# Patient Record
Sex: Female | Born: 1980 | Race: Black or African American | Hispanic: No | Marital: Single | Smoking: Never smoker
Health system: Southern US, Community
[De-identification: ages and names within clinical notes are randomized; demographics above are authoritative.]

## PROBLEM LIST (undated history)

## (undated) ENCOUNTER — Inpatient Hospital Stay (HOSPITAL_COMMUNITY): Payer: Self-pay

## (undated) DIAGNOSIS — O4290 Premature rupture of membranes, unspecified as to length of time between rupture and onset of labor, unspecified weeks of gestation: Secondary | ICD-10-CM

## (undated) DIAGNOSIS — R51 Headache: Secondary | ICD-10-CM

## (undated) DIAGNOSIS — D649 Anemia, unspecified: Secondary | ICD-10-CM

## (undated) HISTORY — PX: NO PAST SURGERIES: SHX2092

---

## 2007-03-07 ENCOUNTER — Emergency Department: Payer: Self-pay | Admitting: Emergency Medicine

## 2007-12-08 ENCOUNTER — Emergency Department: Payer: Self-pay | Admitting: Emergency Medicine

## 2007-12-08 ENCOUNTER — Other Ambulatory Visit: Payer: Self-pay

## 2008-05-18 IMAGING — CT CT ABD-PELV W/O CM
1 of 2 series · 15 of 32 positions shown, 19 images · non-contrast
Comparison: none

REASON FOR EXAM: (1) rm 3   abd pain; (2) rm 3  abd pain
COMMENTS:

[Series 2: stone · axial · 0.54mm/px · z∈[-4,+376]mm · 15 of 142 slices shown, 19 images]
[im 10/142  soft-tissue]
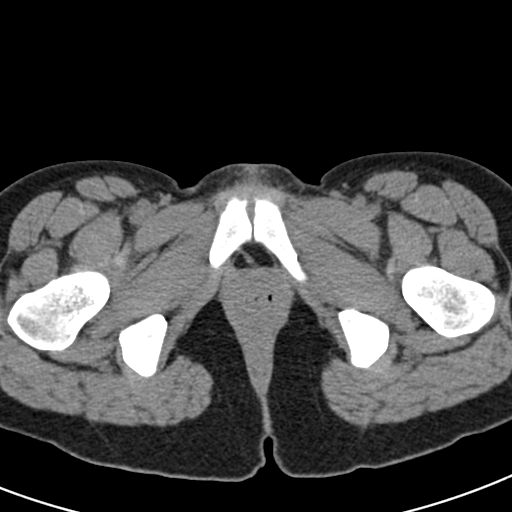
[im 10/142  bone]
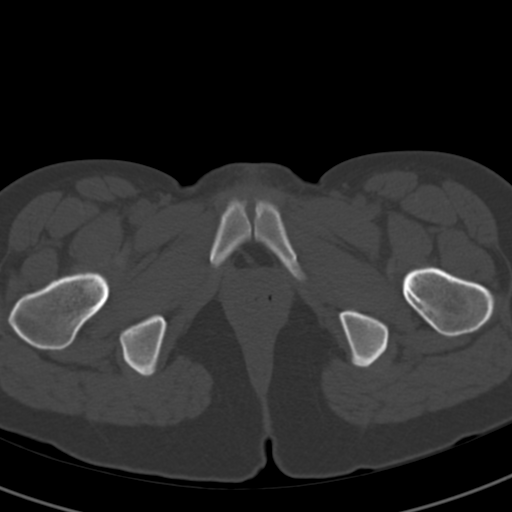
[im 20/142  soft-tissue]
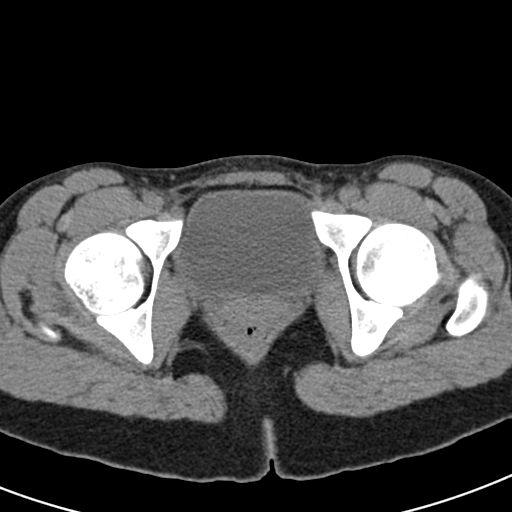
[im 30/142  soft-tissue]
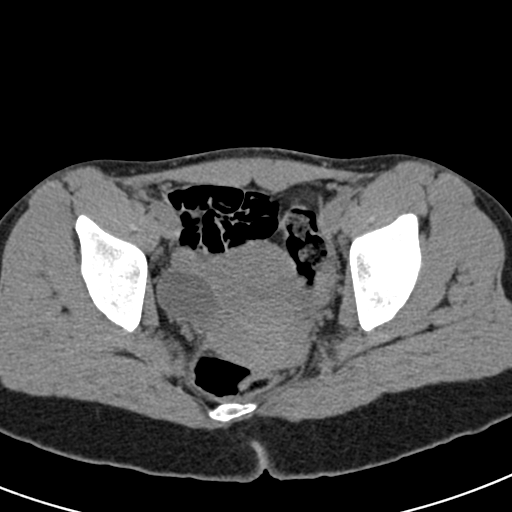
[im 39/142  soft-tissue]
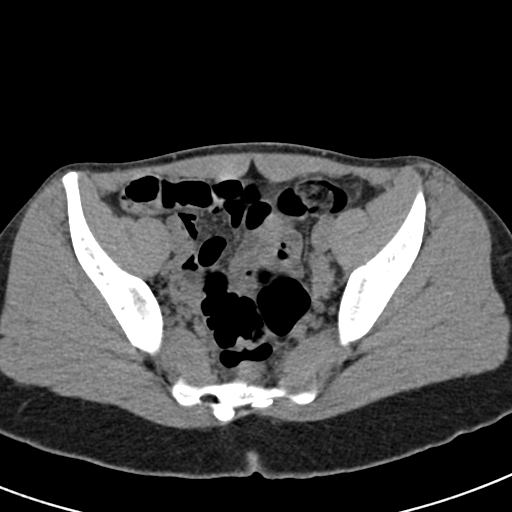
[im 49/142  soft-tissue]
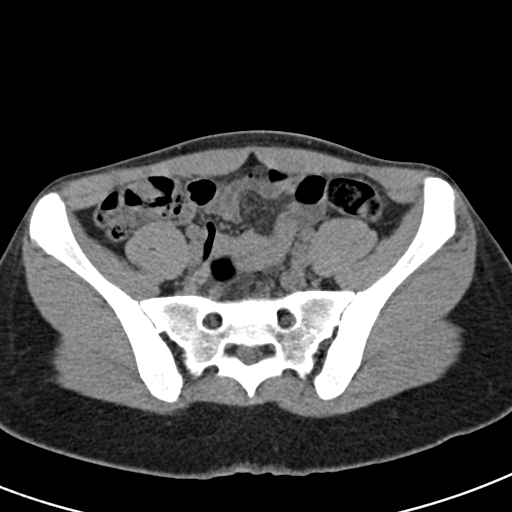
[im 59/142  soft-tissue]
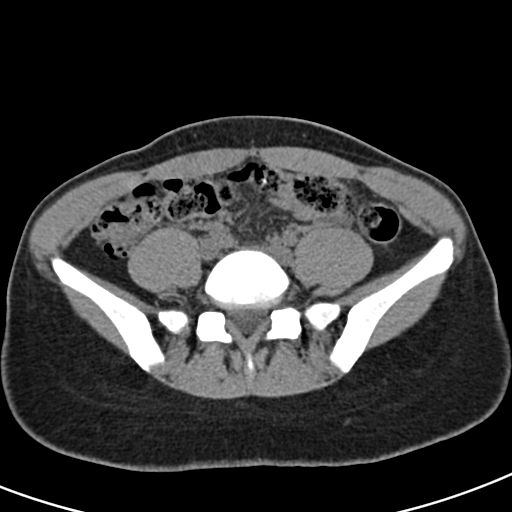
[im 73/142  soft-tissue]
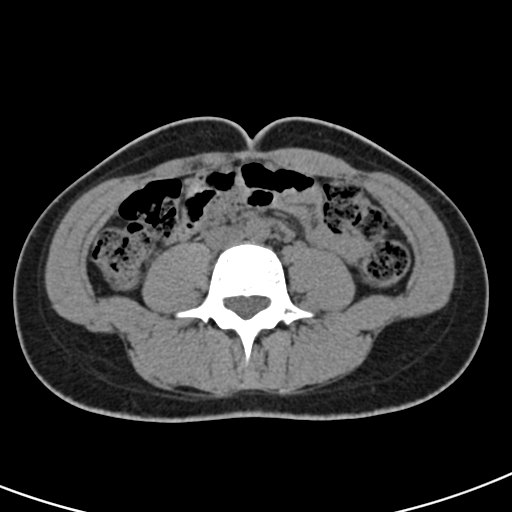
[im 83/142  soft-tissue]
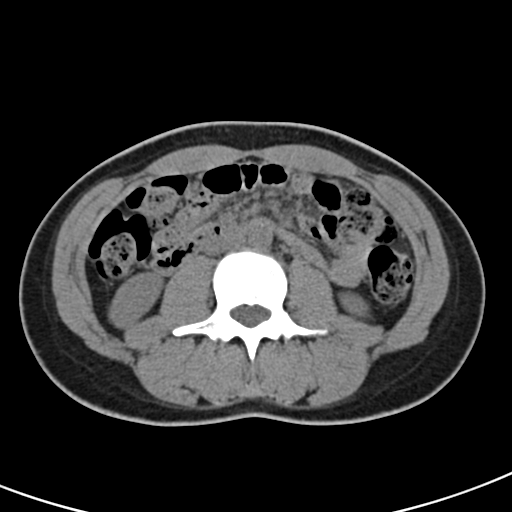
[im 93/142  soft-tissue]
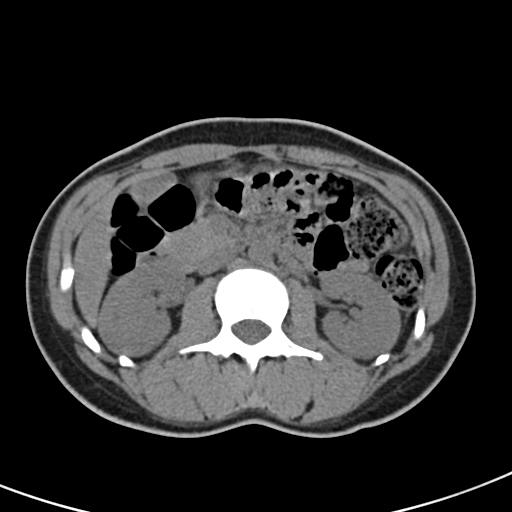
[im 93/142  bone]
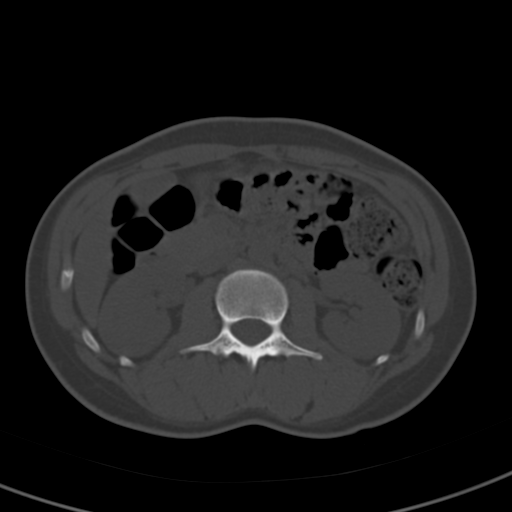
[im 103/142  soft-tissue]
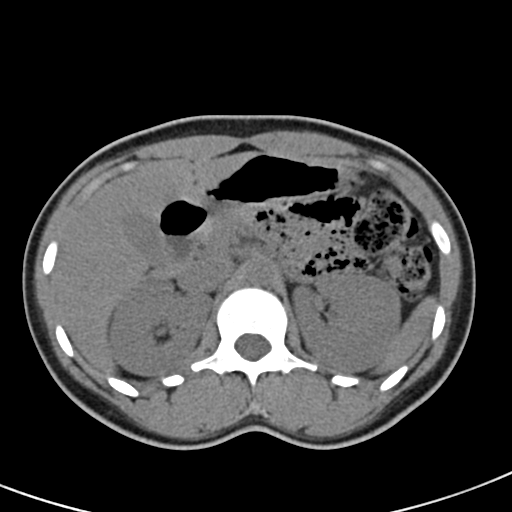
[im 112/142  soft-tissue]
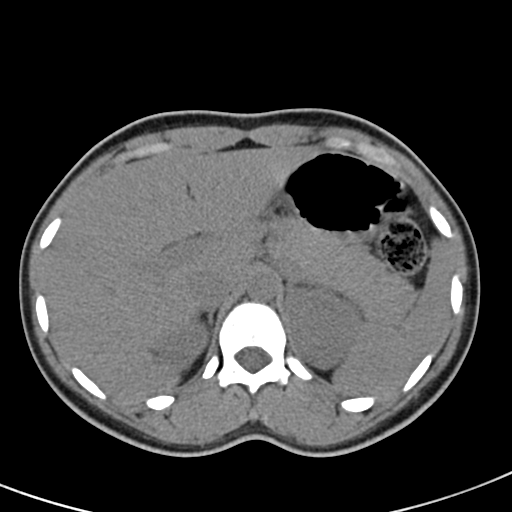
[im 122/142  soft-tissue]
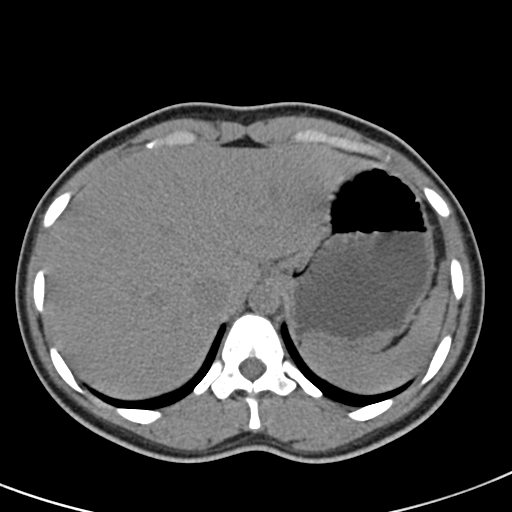
[im 122/142  lung]
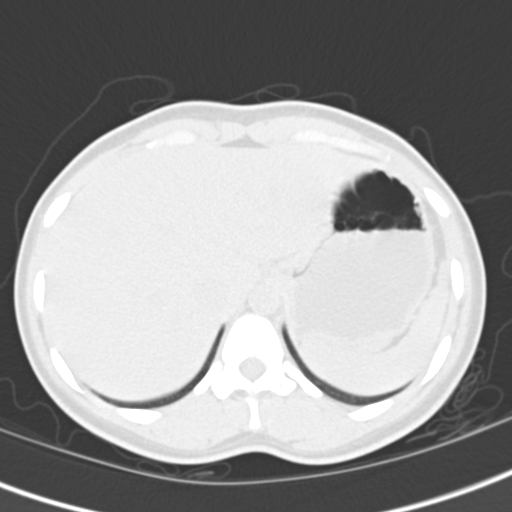
[im 127/142  lung]
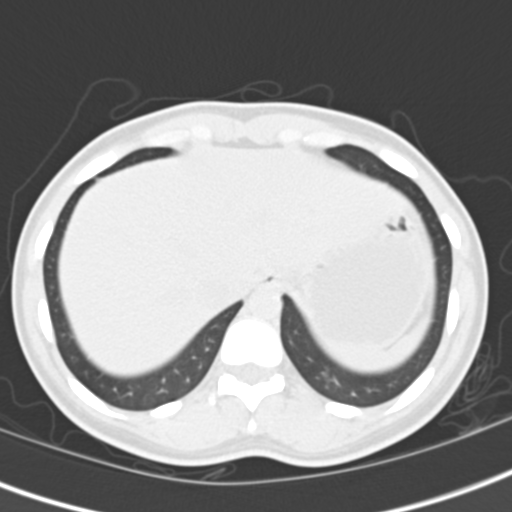
[im 132/142  soft-tissue]
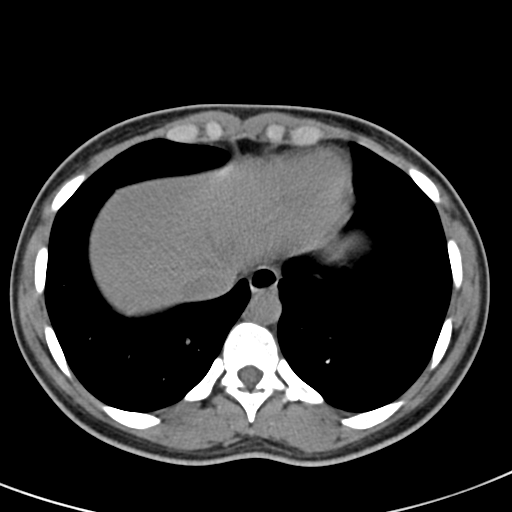
[im 132/142  lung]
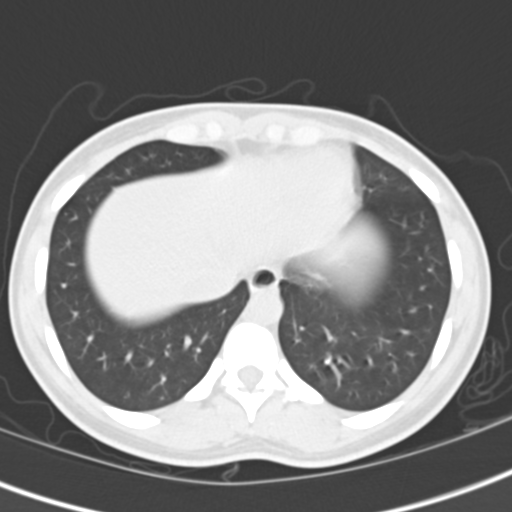
[im 137/142  lung]
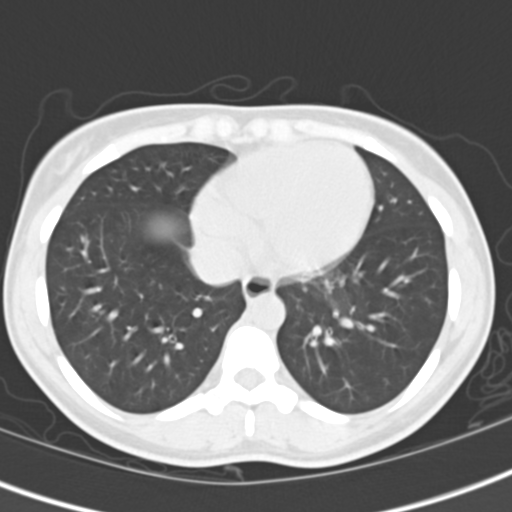

[15 of 32 positions shown; findings below may reference images not displayed]

PROCEDURE:     CT  - CT ABDOMEN AND PELVIS W[DATE] [DATE]

RESULT:      The study was performed without oral or intravenous contrast
material as requested.

The liver, stomach, spleen, pancreas, gallbladder, kidneys, and adrenal
glands are normal in appearance. The paraaortic and pericaval regions are
normal in appearance as well for the noncontrast study. The bowel gas
pattern is within the limits of normal. There is a radiodense structure
which appear to be intraluminal that may reflect a radiodense medication
seen best on image to 86 in the upper aspect of the pelvis. There are no
findings suspicious for acute appendicitis. I see no significant free fluid
within the pelvis. There is a cystic adnexal process on the RIGHT which
measures at least 3.5 x 2.7 cm. The uterus and LEFT adnexal structures are
normal in appearance for the noncontrast exam. The partially distended
urinary bladder is normal in appearance. The lung bases are clear.
IMPRESSION: 1. There is a cystic RIGHT adnexal process that may reflect a physiologic
cyst but it measured 3.5 cm in diameter. Further evaluation with clinical
exam and possibly pelvic ultrasound and additional laboratory values may be
of value.
2. I do not see evidence of acute bowel abnormality.
3. I do not see evidence of acute urinary tract abnormality nor acute
hepatobiliary abnormality. Followup contrast-enhanced imaging is available
if clinically desired.

## 2013-07-20 ENCOUNTER — Emergency Department (HOSPITAL_COMMUNITY)
Admission: EM | Admit: 2013-07-20 | Discharge: 2013-07-21 | Disposition: A | Discharge status: Home / Self Care | Payer: Medicaid Other | Attending: Emergency Medicine | Admitting: Emergency Medicine

## 2013-07-20 ENCOUNTER — Encounter (HOSPITAL_COMMUNITY): Payer: Self-pay | Admitting: Emergency Medicine

## 2013-07-20 DIAGNOSIS — R1032 Left lower quadrant pain: Secondary | ICD-10-CM | POA: Insufficient documentation

## 2013-07-20 DIAGNOSIS — R509 Fever, unspecified: Secondary | ICD-10-CM | POA: Insufficient documentation

## 2013-07-20 DIAGNOSIS — Z3202 Encounter for pregnancy test, result negative: Secondary | ICD-10-CM | POA: Insufficient documentation

## 2013-07-20 DIAGNOSIS — K529 Noninfective gastroenteritis and colitis, unspecified: Secondary | ICD-10-CM

## 2013-07-20 DIAGNOSIS — K5289 Other specified noninfective gastroenteritis and colitis: Secondary | ICD-10-CM | POA: Insufficient documentation

## 2013-07-20 DIAGNOSIS — Z79899 Other long term (current) drug therapy: Secondary | ICD-10-CM | POA: Insufficient documentation

## 2013-07-20 LAB — CBC WITH DIFFERENTIAL/PLATELET
Basophils Absolute: 0 10*3/uL (ref 0.0–0.1)
Eosinophils Relative: 0 % (ref 0–5)
HCT: 32.1 % — ABNORMAL LOW (ref 36.0–46.0)
Lymphocytes Relative: 7 % — ABNORMAL LOW (ref 12–46)
Lymphs Abs: 0.9 10*3/uL (ref 0.7–4.0)
MCV: 85.1 fL (ref 78.0–100.0)
Neutro Abs: 10.6 10*3/uL — ABNORMAL HIGH (ref 1.7–7.7)
Platelets: 251 10*3/uL (ref 150–400)
RBC: 3.77 MIL/uL — ABNORMAL LOW (ref 3.87–5.11)
WBC: 12.6 10*3/uL — ABNORMAL HIGH (ref 4.0–10.5)

## 2013-07-20 LAB — URINALYSIS, ROUTINE W REFLEX MICROSCOPIC
Bilirubin Urine: NEGATIVE
Ketones, ur: NEGATIVE mg/dL
Nitrite: NEGATIVE
Protein, ur: 30 mg/dL — AB
Specific Gravity, Urine: 1.015 (ref 1.005–1.030)
Urobilinogen, UA: 0.2 mg/dL (ref 0.0–1.0)

## 2013-07-20 LAB — URINE MICROSCOPIC-ADD ON

## 2013-07-20 LAB — COMPREHENSIVE METABOLIC PANEL
ALT: 9 U/L (ref 0–35)
AST: 16 U/L (ref 0–37)
Alkaline Phosphatase: 62 U/L (ref 39–117)
CO2: 26 mEq/L (ref 19–32)
Calcium: 9.1 mg/dL (ref 8.4–10.5)
Chloride: 103 mEq/L (ref 96–112)
GFR calc Af Amer: >90 mL/min (ref >90)
GFR calc non Af Amer: 90 mL/min — ABNORMAL LOW (ref >90)
Glucose, Bld: 100 mg/dL — ABNORMAL HIGH (ref 70–99)
Sodium: 137 mEq/L (ref 135–145)
Total Bilirubin: 0.8 mg/dL (ref 0.3–1.2)

## 2013-07-20 MED ORDER — ACETAMINOPHEN 325 MG PO TABS
650.0000 mg | ORAL_TABLET | Freq: Once | ORAL | Status: AC
  Administered 2013-07-20: 650 mg via ORAL
  Filled 2013-07-20: qty 2

## 2013-07-20 NOTE — ED Notes (Signed)
Nurse First Rounds: Nurse explained delay , wait time and process to pt.  

## 2013-07-20 NOTE — ED Notes (Signed)
Pt reports LLQ pain with nausea onset yesterday with constipation , denies emesis or diarrhea , no urinary discomfort , febrile at triage .

## 2013-07-21 ENCOUNTER — Encounter (HOSPITAL_COMMUNITY): Payer: Self-pay | Admitting: Radiology

## 2013-07-21 ENCOUNTER — Emergency Department (HOSPITAL_COMMUNITY): Payer: Medicaid Other

## 2013-07-21 LAB — WET PREP, GENITAL
WBC, Wet Prep HPF POC: NONE SEEN
Yeast Wet Prep HPF POC: NONE SEEN

## 2013-07-21 MED ORDER — IOHEXOL 300 MG/ML  SOLN
100.0000 mL | Freq: Once | INTRAMUSCULAR | Status: AC | PRN
  Administered 2013-07-21: 100 mL via INTRAVENOUS

## 2013-07-21 MED ORDER — ONDANSETRON HCL 4 MG/2ML IJ SOLN
4.0000 mg | Freq: Once | INTRAMUSCULAR | Status: AC
  Administered 2013-07-21: 4 mg via INTRAVENOUS
  Filled 2013-07-21: qty 2

## 2013-07-21 MED ORDER — ONDANSETRON HCL 4 MG PO TABS: 4.0000 mg | ORAL_TABLET | Freq: Four times a day (QID) | ORAL | Status: DC | PRN

## 2013-07-21 MED ORDER — SODIUM CHLORIDE 0.9 % IV BOLUS (SEPSIS)
1000.0000 mL | Freq: Once | INTRAVENOUS | Status: AC
  Administered 2013-07-21: 1000 mL via INTRAVENOUS

## 2013-07-21 MED ORDER — MORPHINE SULFATE 4 MG/ML IJ SOLN
4.0000 mg | Freq: Once | INTRAMUSCULAR | Status: AC
  Administered 2013-07-21: 4 mg via INTRAVENOUS
  Filled 2013-07-21: qty 1

## 2013-07-21 MED ORDER — IOHEXOL 300 MG/ML  SOLN
25.0000 mL | INTRAMUSCULAR | Status: AC
  Administered 2013-07-21: 25 mL via ORAL

## 2013-07-21 NOTE — ED Provider Notes (Signed)
CSN: [id number]     Arrival date & time 07/20/13  2100 History   First MD Initiated Contact with Patient 07/20/13 2348     Chief Complaint  Patient presents with  . Abdominal Pain   (Consider location/radiation/quality/duration/timing/severity/associated sxs/prior Treatment) HPI Comments: 32 year old female presents with about 24 hours of lower abdominal pain. She states it started relatively acutely and has been intermittent since last night. She states that the pain comes and goes and lasts only for a few seconds to minutes. She currently denies any abdominal pain. The pain usually starts midline in her lower abdomen it seems to radiate to her left flank. She's had nausea without vomiting. She is normally constipated, however she's had 3 episodes of loose stools today. She has never had pain like this before. She denies any dysuria, hematuria, vaginal bleeding, vaginal discharge. She states she's had subjective fevers and chills. She has not eaten anything today. She denies any history of kidney stones. Does not think she is pregnant and does not have high concern for an STD.  The history is provided by the patient.    History reviewed. No pertinent past medical history. History reviewed. No pertinent past surgical history. No family history on file. History  Substance Use Topics  . Smoking status: Never Smoker   . Smokeless tobacco: Not on file  . Alcohol Use: Yes   OB History   Grav Para Term Preterm Abortions TAB SAB Ect Mult Living                 Review of Systems  Constitutional: Positive for fever and chills.  HENT: Negative for congestion and rhinorrhea.   Respiratory: Negative for cough and shortness of breath.   Gastrointestinal: Positive for nausea, abdominal pain and diarrhea. Negative for vomiting and blood in stool.  Genitourinary: Negative for dysuria, hematuria, vaginal bleeding, vaginal discharge, vaginal pain and menstrual problem.  Musculoskeletal: Negative for  back pain.  All other systems reviewed and are negative.    Allergies  Review of patient's allergies indicates no known allergies.  Home Medications   Current Outpatient Rx  Name  Route  Sig  Dispense  Refill  . Biotin 10 MG CAPS   Oral   Take 1 capsule by mouth every evening.         Marland Kitchen FERROUS SULFATE PO   Oral   Take 1 tablet by mouth daily.         . metroNIDAZOLE (METROGEL) 0.75 % vaginal gel   Vaginal   Place 1 Applicatorful vaginally 2 (two) times a week.          BP 98/71  Pulse 71  Temp(Src) 99.1 F (37.3 C) (Oral)  Resp 14  Ht 5\' 1"  (1.549 m)  Wt 135 lb (61.236 kg)  BMI 25.52 kg/m2  SpO2 99%  LMP 07/05/2013 Physical Exam  Nursing note and vitals reviewed. Constitutional: She is oriented to person, place, and time. She appears well-developed and well-nourished. No distress.  HENT:  Head: Normocephalic and atraumatic.  Right Ear: External ear normal.  Left Ear: External ear normal.  Nose: Nose normal.  Eyes: Right eye exhibits no discharge. Left eye exhibits no discharge.  Cardiovascular: Normal rate, regular rhythm and normal heart sounds.   Pulmonary/Chest: Effort normal and breath sounds normal.  Abdominal: Soft. Normal appearance. There is tenderness in the right lower quadrant. There is no CVA tenderness.  Genitourinary: Vagina normal. Uterus is not tender. Cervix exhibits no motion tenderness. Right adnexum displays  no mass and no tenderness. Left adnexum displays no mass and no tenderness.  Neurological: She is alert and oriented to person, place, and time.  Skin: Skin is warm and dry.    ED Course  Procedures (including critical care time) Labs Review Labs Reviewed  CBC WITH DIFFERENTIAL - Abnormal; Notable for the following:    WBC 12.6 (*)    RBC 3.77 (*)    Hemoglobin 11.1 (*)    HCT 32.1 (*)    Neutrophils Relative % 84 (*)    Neutro Abs 10.6 (*)    Lymphocytes Relative 7 (*)    All other components within normal limits   COMPREHENSIVE METABOLIC PANEL - Abnormal; Notable for the following:    Potassium 3.4 (*)    Glucose, Bld 100 (*)    GFR calc non Af Amer 90 (*)    All other components within normal limits  URINALYSIS, ROUTINE W REFLEX MICROSCOPIC - Abnormal; Notable for the following:    APPearance CLOUDY (*)    Hgb urine dipstick SMALL (*)    Protein, ur 30 (*)    All other components within normal limits  URINE MICROSCOPIC-ADD ON - Abnormal; Notable for the following:    Squamous Epithelial / LPF MANY (*)    All other components within normal limits  POCT PREGNANCY, URINE   Imaging Review US Transvaginal Non-ob  07/21/2013   *RADIOLOGY REPORT*  Clinical Data:  Right lower quadrant pain.  Rule out torsion.  TRANSABDOMINAL AND TRANSVAGINAL ULTRASOUND OF PELVIS DOPPLER ULTRASOUND OF OVARIES  Technique:  Both transabdominal and transvaginal ultrasound examinations of the pelvis were performed. Transabdominal technique was performed for global imaging of the pelvis including uterus, ovaries, adnexal regions, and pelvic cul-de-sac.  It was necessary to proceed with endovaginal exam following the transabdominal exam to visualize the ovaries and uterus.  Color and duplex Doppler ultrasound was utilized to evaluate blood flow to the ovaries.  Comparison:  CT from earlier the same day.  FINDINGS  Uterus:  1.4 cm heterogeneous echotexture mass in the posterior body, intramural.  Normal sized, 7 x 3 x 6 cm.  Endometrium:  9 mm inhomogeneous.  Right ovary: Normal in appearance, 3.2 x 2.2 x 2.2 cm. Size differential related to corpus luteal cyst, measuring 16 mm diameter.  Left ovary: Normal in appearance, 1.7 x 0.8 x 1.3 cm.  Pulsed Doppler evaluation demonstrates normal low-resistance arterial and venous waveforms in both ovaries.  Other:  Moderate free fluid in the pelvis, likely reactive to the patient's colitis.  IMPRESSION:  1.  No ovarian torsion or other acute abnormality. 2.  1.4 cm fibroid in the posterior uterine  body.   Original Report Authenticated By: Tiburcio Pea   US Pelvis Complete  07/21/2013   *RADIOLOGY REPORT*  Clinical Data:  Right lower quadrant pain.  Rule out torsion.  TRANSABDOMINAL AND TRANSVAGINAL ULTRASOUND OF PELVIS DOPPLER ULTRASOUND OF OVARIES  Technique:  Both transabdominal and transvaginal ultrasound examinations of the pelvis were performed. Transabdominal technique was performed for global imaging of the pelvis including uterus, ovaries, adnexal regions, and pelvic cul-de-sac.  It was necessary to proceed with endovaginal exam following the transabdominal exam to visualize the ovaries and uterus.  Color and duplex Doppler ultrasound was utilized to evaluate blood flow to the ovaries.  Comparison:  CT from earlier the same day.  FINDINGS  Uterus:  1.4 cm heterogeneous echotexture mass in the posterior body, intramural.  Normal sized, 7 x 3 x 6 cm.  Endometrium:  9 mm inhomogeneous.  Right ovary: Normal in appearance, 3.2 x 2.2 x 2.2 cm. Size differential related to corpus luteal cyst, measuring 16 mm diameter.  Left ovary: Normal in appearance, 1.7 x 0.8 x 1.3 cm.  Pulsed Doppler evaluation demonstrates normal low-resistance arterial and venous waveforms in both ovaries.  Other:  Moderate free fluid in the pelvis, likely reactive to the patient's colitis.  IMPRESSION:  1.  No ovarian torsion or other acute abnormality. 2.  1.4 cm fibroid in the posterior uterine body.   Original Report Authenticated By: Tiburcio Pea   Ct Abdomen Pelvis W Contrast  07/21/2013   *RADIOLOGY REPORT*  Clinical Data: Right lower quadrant tenderness.  CT ABDOMEN AND PELVIS WITH CONTRAST  Technique:  Multidetector CT imaging of the abdomen and pelvis was performed following the standard protocol during bolus administration of intravenous contrast.  Contrast: OMNIPAQUE IOHEXOL 300 MG/ML  SOLN  Comparison: None.  Findings:  BODY WALL: Unremarkable.  LOWER CHEST:  Mediastinum: Unremarkable.  Lungs/pleura: No  consolidation.  ABDOMEN/PELVIS:  Liver: No focal abnormality.  Biliary: No evidence of biliary obstruction or stone.  Pancreas: Unremarkable.  Spleen: Unremarkable.  Adrenals: Unremarkable.  Kidneys and ureters: No hydronephrosis or stone.  Bladder: Unremarkable.  Bowel: Fairly marked circumferential submucosal edema and wall thickening involving the colon, extending from the cecum to the sigmoid.  The sigmoid is distended by formed stool, but shows no clear wall thickening.  No proximal obstruction.  No definitive terminal ileal involvement.  Normal appendix.  Retroperitoneum: No mass or adenopathy.  Peritoneum: Small-volume reactive free fluid in the lower abdomen.  Reproductive: Right-sided corpus luteal cyst.  Vascular: No acute abnormality.  OSSEOUS: No acute abnormalities. No suspicious lytic or blastic lesions.  IMPRESSION: Colitis that is likely either inflammatory or infectious.   Original Report Authenticated By: Tiburcio Pea   [place] Art/ven Flow Abd Pelv Doppler  07/21/2013   *RADIOLOGY REPORT*  Clinical Data:  Right lower quadrant pain.  Rule out torsion.  TRANSABDOMINAL AND TRANSVAGINAL ULTRASOUND OF PELVIS DOPPLER ULTRASOUND OF OVARIES  Technique:  Both transabdominal and transvaginal ultrasound examinations of the pelvis were performed. Transabdominal technique was performed for global imaging of the pelvis including uterus, ovaries, adnexal regions, and pelvic cul-de-sac.  It was necessary to proceed with endovaginal exam following the transabdominal exam to visualize the ovaries and uterus.  Color and duplex Doppler ultrasound was utilized to evaluate blood flow to the ovaries.  Comparison:  CT from earlier the same day.  FINDINGS  Uterus:  1.4 cm heterogeneous echotexture mass in the posterior body, intramural.  Normal sized, 7 x 3 x 6 cm.  Endometrium:  9 mm inhomogeneous.  Right ovary: Normal in appearance, 3.2 x 2.2 x 2.2 cm. Size differential related to corpus luteal cyst, measuring 16 mm  diameter.  Left ovary: Normal in appearance, 1.7 x 0.8 x 1.3 cm.  Pulsed Doppler evaluation demonstrates normal low-resistance arterial and venous waveforms in both ovaries.  Other:  Moderate free fluid in the pelvis, likely reactive to the patient's colitis.  IMPRESSION:  1.  No ovarian torsion or other acute abnormality. 2.  1.4 cm fibroid in the posterior uterine body.   Original Report Authenticated By: Tiburcio Pea    MDM   1. Gastroenteritis    Patient initially had RLQ tenderness but has normal appendix and no signs of torsion on u/s. Her abd pain resolved in ED. She had multiple episodes of diarrhea in ED along with vomiting. I feel her sx  are most c/w gastroenteritis. She does not have any chron's or UC hx, and has not had any bloody stools. She is able to take po in ED, I feel she can be managed as an outpatient with oral fluids and anti-emetics. Patient is agreeable to plan and understands return precautions.     Audree Camel, MD 07/21/13 [phone]

## 2013-07-21 NOTE — ED Notes (Addendum)
Pt given d/c instructions and verbalized understanding. NAD at this time. VS are WNL.  

## 2013-07-22 LAB — GC/CHLAMYDIA PROBE AMP
CT Probe RNA: NEGATIVE
GC Probe RNA: NEGATIVE

## 2013-11-16 NOTE — L&D Delivery Note (Signed)
Operative Delivery Note At 1:45 PM a viable female was delivered via Vaginal, [employer], operational).  Presentation: vertex; Position: Left,, Occiput,, Anterior; Station: +2.  Verbal consent: obtained from patient.  Risks and benefits discussed in detail.  Risks include, but are not limited to the risks of anesthesia, bleeding, infection, damage to maternal tissues, fetal cephalhematoma.  There is also the risk of inability to effect vaginal delivery of the head, or shoulder dystocia that cannot be resolved by established maneuvers, leading to the need for emergency cesarean section.  Prolonged labor, repetitive late decels with pushing.  One pop-off during delivery.  APGAR: 7, 8; weight pending.   Placenta status: Intact, Spontaneous.   Cord: 3 vessels with the following complications: None.  Cord pH: pending  Anesthesia: Epidural  Instruments: Mushroom cup Episiotomy: None Lacerations: 1st degree Suture Repair: none Est. Blood Loss (mL): 500  Mom to postpartum.  Baby to Couplet care / Skin to Skin.  Discussed circumcision procedure and risks, she would like to proceed in am.    Kassidy Dockendorf D 07/16/2014, 2:07 PM

## 2014-01-03 LAB — OB RESULTS CONSOLE ANTIBODY SCREEN: Antibody Screen: NEGATIVE

## 2014-01-03 LAB — OB RESULTS CONSOLE RUBELLA ANTIBODY, IGM: Rubella: IMMUNE

## 2014-01-03 LAB — OB RESULTS CONSOLE ABO/RH: RH TYPE: POSITIVE

## 2014-01-03 LAB — OB RESULTS CONSOLE HIV ANTIBODY (ROUTINE TESTING): HIV: NONREACTIVE

## 2014-01-03 LAB — OB RESULTS CONSOLE HEPATITIS B SURFACE ANTIGEN: Hepatitis B Surface Ag: NEGATIVE

## 2014-01-03 LAB — OB RESULTS CONSOLE GC/CHLAMYDIA
Chlamydia: NEGATIVE
GC PROBE AMP, GENITAL: NEGATIVE

## 2014-01-03 LAB — OB RESULTS CONSOLE RPR: RPR: NONREACTIVE

## 2014-06-25 LAB — OB RESULTS CONSOLE GC/CHLAMYDIA: Gonorrhea: NEGATIVE

## 2014-06-25 LAB — OB RESULTS CONSOLE GBS: STREP GROUP B AG: NEGATIVE

## 2014-07-14 ENCOUNTER — Encounter (HOSPITAL_COMMUNITY): Payer: Self-pay

## 2014-07-14 ENCOUNTER — Inpatient Hospital Stay (HOSPITAL_COMMUNITY)
Admission: AD | Admit: 2014-07-14 | Discharge: 2014-07-18 | DRG: 775 | Disposition: A | Discharge status: Home / Self Care | Payer: Medicaid Other | Source: Ambulatory Visit | Attending: Obstetrics and Gynecology | Admitting: Obstetrics and Gynecology

## 2014-07-14 DIAGNOSIS — Z825 Family history of asthma and other chronic lower respiratory diseases: Secondary | ICD-10-CM | POA: Diagnosis not present

## 2014-07-14 DIAGNOSIS — O9902 Anemia complicating childbirth: Secondary | ICD-10-CM | POA: Diagnosis present

## 2014-07-14 DIAGNOSIS — D573 Sickle-cell trait: Secondary | ICD-10-CM | POA: Diagnosis present

## 2014-07-14 DIAGNOSIS — Z8041 Family history of malignant neoplasm of ovary: Secondary | ICD-10-CM

## 2014-07-14 DIAGNOSIS — Z833 Family history of diabetes mellitus: Secondary | ICD-10-CM | POA: Diagnosis not present

## 2014-07-14 DIAGNOSIS — O4290 Premature rupture of membranes, unspecified as to length of time between rupture and onset of labor, unspecified weeks of gestation: Secondary | ICD-10-CM

## 2014-07-14 HISTORY — DX: Headache: R51

## 2014-07-14 HISTORY — DX: Premature rupture of membranes, unspecified as to length of time between rupture and onset of labor, unspecified weeks of gestation: O42.90

## 2014-07-14 HISTORY — DX: Anemia, unspecified: D64.9

## 2014-07-14 LAB — CBC
HCT: 29.7 % — ABNORMAL LOW (ref 36.0–46.0)
HEMOGLOBIN: 9.8 g/dL — AB (ref 12.0–15.0)
MCH: 29.3 pg (ref 26.0–34.0)
MCHC: 33 g/dL (ref 30.0–36.0)
MCV: 88.7 fL (ref 78.0–100.0)
PLATELETS: 213 10*3/uL (ref 150–400)
RBC: 3.35 MIL/uL — ABNORMAL LOW (ref 3.87–5.11)
RDW: 14.6 % (ref 11.5–15.5)
WBC: 9.8 10*3/uL (ref 4.0–10.5)

## 2014-07-14 LAB — SAMPLE TO BLOOD BANK

## 2014-07-14 MED ORDER — OXYTOCIN 40 UNITS IN LACTATED RINGERS INFUSION - SIMPLE MED
1.0000 m[IU]/min | INTRAVENOUS | Status: DC
  Administered 2014-07-15: 2 m[IU]/min via INTRAVENOUS
  Administered 2014-07-15: 8 m[IU]/min via INTRAVENOUS
  Administered 2014-07-15: 14 m[IU]/min via INTRAVENOUS
  Administered 2014-07-16: 26 m[IU]/min via INTRAVENOUS
  Filled 2014-07-14 (×2): qty 1000

## 2014-07-14 MED ORDER — ACETAMINOPHEN 325 MG PO TABS: 650.0000 mg | ORAL_TABLET | ORAL | Status: DC | PRN

## 2014-07-14 MED ORDER — TERBUTALINE SULFATE 1 MG/ML IJ SOLN: 0.2500 mg | Freq: Once | INTRAMUSCULAR | Status: DC | PRN

## 2014-07-14 MED ORDER — CITRIC ACID-SODIUM CITRATE 334-500 MG/5ML PO SOLN: 30.0000 mL | ORAL | Status: DC | PRN

## 2014-07-14 MED ORDER — BUTORPHANOL TARTRATE 1 MG/ML IJ SOLN
1.0000 mg | INTRAMUSCULAR | Status: DC | PRN
  Administered 2014-07-15 (×2): 1 mg via INTRAVENOUS
  Filled 2014-07-14 (×2): qty 1

## 2014-07-14 MED ORDER — ONDANSETRON HCL 4 MG/2ML IJ SOLN
4.0000 mg | Freq: Four times a day (QID) | INTRAMUSCULAR | Status: DC | PRN
  Administered 2014-07-15 – 2014-07-16 (×3): 4 mg via INTRAVENOUS
  Filled 2014-07-14 (×3): qty 2

## 2014-07-14 MED ORDER — LACTATED RINGERS IV SOLN
INTRAVENOUS | Status: DC
  Administered 2014-07-15: 23:00:00 via INTRAVENOUS
  Administered 2014-07-15: 1000 mL via INTRAVENOUS
  Administered 2014-07-16 (×2): via INTRAVENOUS

## 2014-07-14 MED ORDER — MISOPROSTOL 100 MCG PO TABS
50.0000 ug | ORAL_TABLET | ORAL | Status: DC
  Administered 2014-07-14 – 2014-07-15 (×2): 50 ug via ORAL
  Filled 2014-07-14 (×2): qty 1
  Filled 2014-07-14: qty 0.5
  Filled 2014-07-14 (×15): qty 1

## 2014-07-14 MED ORDER — OXYTOCIN 40 UNITS IN LACTATED RINGERS INFUSION - SIMPLE MED: 62.5000 mL/h | INTRAVENOUS | Status: DC

## 2014-07-14 MED ORDER — IBUPROFEN 600 MG PO TABS: 600.0000 mg | ORAL_TABLET | Freq: Four times a day (QID) | ORAL | Status: DC | PRN

## 2014-07-14 MED ORDER — LACTATED RINGERS IV SOLN
500.0000 mL | INTRAVENOUS | Status: DC | PRN
  Administered 2014-07-16: 500 mL via INTRAVENOUS

## 2014-07-14 MED ORDER — OXYTOCIN BOLUS FROM INFUSION: 500.0000 mL | INTRAVENOUS | Status: DC

## 2014-07-14 MED ORDER — LIDOCAINE HCL (PF) 1 % IJ SOLN
30.0000 mL | INTRAMUSCULAR | Status: DC | PRN
  Filled 2014-07-14: qty 30

## 2014-07-14 MED ORDER — TERBUTALINE SULFATE 1 MG/ML IJ SOLN: 0.2500 mg | Freq: Once | INTRAMUSCULAR | Status: AC | PRN

## 2014-07-14 MED ORDER — OXYCODONE-ACETAMINOPHEN 5-325 MG PO TABS: 1.0000 | ORAL_TABLET | ORAL | Status: DC | PRN

## 2014-07-14 NOTE — H&P (Signed)
Lori Pace is a 33 y.o. female G1P0  At 60 with ROM.  On arrival to L&D gross rupture on exam.  +FM, gush of clear fluid, no VB, occ ctx.  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 Low risk screening by panorama.     Maternal Medical History:  Reason for admission: Rupture of membranes.   Contractions: Frequency: rare.    Fetal activity: Perceived fetal activity is normal.    Prenatal complications: no prenatal complications Prenatal Complications - Diabetes: none.    OB History   Grav Para Term Preterm Abortions TAB SAB Ect Mult Living   1             h/o abn pap No STD G1 present  Past Medical History  Diagnosis Date  . Headache(784.0)   . Anemia   . ROM (rupture of membranes), premature 07/14/2014   Past Surgical History  Procedure Laterality Date  . No past surgeries     Family History: family history includes Asthma in her sister; Cancer in her maternal aunt; Diabetes in her paternal grandmother.HTN, ovarian Cancer, thyroid dz Social History:  reports that she has never smoked. She has never used smokeless tobacco. She reports that she does not drink alcohol or use illicit drugs. Meds Flexeril, PNV All NKDA, LATEX sensitive    Prenatal Transfer Tool  Maternal Diabetes: No Genetic Screening: Normal Maternal Ultrasounds/Referrals: Normal Fetal Ultrasounds or other Referrals:  None Maternal Substance Abuse:  No Significant Maternal Medications:  None Significant Maternal Lab Results:  Lab values include: Group B Strep negative Other Comments:  Sickle Cell Trait, FOB neg; dated by 1st tri [place]  Review of Systems  Constitutional: Negative.   HENT: Negative.   Eyes: Negative.   Respiratory: Negative.   Cardiovascular: Negative.   Gastrointestinal: Negative.   Genitourinary: Negative.   Musculoskeletal: Negative.   Skin: Negative.   Neurological: Negative.    Psychiatric/Behavioral: Negative.       Height  (1.549 m), weight 73.029 kg (161 lb), last menstrual period 07/05/2013. Maternal Exam:  Uterine Assessment: Contraction frequency is rare.   Abdomen: Fundal height is appropriate for gestation.   Estimated fetal weight is 7-7.5#.   Fetal presentation: vertex  Introitus: Normal vulva. Normal vagina.  Pelvis: adequate for delivery.   Cervix: Cervix evaluated by digital exam.     Physical Exam  Constitutional: She is oriented to person, place, and time. She appears well-developed and well-nourished.  HENT:  Head: Normocephalic and atraumatic.  Cardiovascular: Normal rate and regular rhythm.   Respiratory: Effort normal and breath sounds normal. No respiratory distress. She has no wheezes.  GI: Soft. Bowel sounds are normal. She exhibits no distension. There is no tenderness.  Musculoskeletal: Normal range of motion.  Neurological: She is alert and oriented to person, place, and time.  Skin: Skin is warm and dry.  Psychiatric: She has a normal mood and affect. Her behavior is normal.    Prenatal labs: ABO, Rh:  O+ Antibody:  neg Rubella:  Immune RPR:   NR HBsAg:   neg HIV:   NR GBS:   neg  Hgb 11.1/Ur Cx neg/ GC neg/ Chl neg/Sickle trait/ CF neg/glucola 100/Plt 300K/  1st tri US WNL Panorama - low risk [place] - ant plac, nl anat, female, 2x2 cm fibroid  Assessment/Plan: 33yo G1P0 at 38+ with SROM for IOL gbbs negative Epidural prn Pitocin to augment prn Expect  SVD    Bovard-Stuckert, Roseanne Juenger 07/14/2014, 6:09 PM

## 2014-07-14 NOTE — MAU Note (Signed)
Per Kriste Basque RN charge birthing suites, pt to go to room 167.

## 2014-07-15 ENCOUNTER — Inpatient Hospital Stay (HOSPITAL_COMMUNITY): Payer: Medicaid Other | Admitting: Anesthesiology

## 2014-07-15 ENCOUNTER — Encounter (HOSPITAL_COMMUNITY): Payer: Medicaid Other | Admitting: Anesthesiology

## 2014-07-15 LAB — RPR

## 2014-07-15 MED ORDER — FENTANYL 2.5 MCG/ML BUPIVACAINE 1/10 % EPIDURAL INFUSION (WH - ANES): 14.0000 mL/h | INTRAMUSCULAR | Status: DC | PRN

## 2014-07-15 MED ORDER — FENTANYL 2.5 MCG/ML BUPIVACAINE 1/10 % EPIDURAL INFUSION (WH - ANES)
14.0000 mL/h | INTRAMUSCULAR | Status: DC | PRN
  Administered 2014-07-15 – 2014-07-16 (×3): 14 mL/h via EPIDURAL
  Filled 2014-07-15 (×3): qty 125

## 2014-07-15 MED ORDER — LACTATED RINGERS IV SOLN: 500.0000 mL | Freq: Once | INTRAVENOUS | Status: DC

## 2014-07-15 MED ORDER — FENTANYL 2.5 MCG/ML BUPIVACAINE 1/10 % EPIDURAL INFUSION (WH - ANES)
INTRAMUSCULAR | Status: DC | PRN
  Administered 2014-07-15: 14 mL/h via EPIDURAL

## 2014-07-15 MED ORDER — PHENYLEPHRINE 40 MCG/ML (10ML) SYRINGE FOR IV PUSH (FOR BLOOD PRESSURE SUPPORT)
80.0000 ug | PREFILLED_SYRINGE | INTRAVENOUS | Status: DC | PRN
  Filled 2014-07-15: qty 2

## 2014-07-15 MED ORDER — PHENYLEPHRINE 40 MCG/ML (10ML) SYRINGE FOR IV PUSH (FOR BLOOD PRESSURE SUPPORT)
80.0000 ug | PREFILLED_SYRINGE | INTRAVENOUS | Status: DC | PRN
  Filled 2014-07-15: qty 10
  Filled 2014-07-15: qty 2

## 2014-07-15 MED ORDER — EPHEDRINE 5 MG/ML INJ
10.0000 mg | INTRAVENOUS | Status: DC | PRN
  Filled 2014-07-15: qty 2

## 2014-07-15 MED ORDER — DIPHENHYDRAMINE HCL 50 MG/ML IJ SOLN: 12.5000 mg | INTRAMUSCULAR | Status: DC | PRN

## 2014-07-15 MED ORDER — LIDOCAINE HCL (PF) 1 % IJ SOLN
INTRAMUSCULAR | Status: DC | PRN
Start: 2014-07-15 — End: 2014-07-16
  Administered 2014-07-15 (×2): 4 mL

## 2014-07-15 NOTE — Progress Notes (Signed)
Patient ID: Lori Pace, female   DOB: [birth year], 31 y.o.   MRN: [id number]  Comfortable with epidural.  AFVSS gen NAD FHTs 120's mod var, category 1 toco irregular  SVE 6.7/90/0-+1  IUPC placed w/o difficulty or complications.  Pitocin at 16mU  Continue current management D/w pt and family r/b/a of LTCS including but not limited to bleeding, infection and damage to surrounding organs.  Questions answered.    Will flip from side to side, continue close monitoring

## 2014-07-15 NOTE — Progress Notes (Signed)
Pt laying on BP cuff

## 2014-07-15 NOTE — Progress Notes (Addendum)
Patient ID: Lori Pace, female   DOB: 07-Jan-1981, 33 y.o.   MRN: [id number]  Uncomfortable with ctx, dealing with it - up and around room.  AFVSS gen NAD FHTs 110-115's, mod var, category 1 toco Q 2-65min  SVE 3.4/90/0- -1  Pitocin @ 10mU Continue current mgmt Expect SVD

## 2014-07-15 NOTE — Anesthesia Preprocedure Evaluation (Signed)
Anesthesia Evaluation  Patient identified by MRN, date of birth, ID band Patient awake    Reviewed: Allergy & Precautions, H&P , NPO status , Patient's Chart, lab work & pertinent test results  Airway Mallampati: II TM Distance: >3 FB Neck ROM: Full    Dental no notable dental hx.    Pulmonary neg pulmonary ROS,  breath sounds clear to auscultation  Pulmonary exam normal       Cardiovascular negative cardio ROS  Rhythm:Regular Rate:Normal     Neuro/Psych  Headaches, negative psych ROS   GI/Hepatic negative GI ROS, Neg liver ROS,   Endo/Other  negative endocrine ROS  Renal/GU negative Renal ROS     Musculoskeletal negative musculoskeletal ROS (+)   Abdominal   Peds  Hematology negative hematology ROS (+) anemia ,   Anesthesia Other Findings   Reproductive/Obstetrics (+) Pregnancy                           Anesthesia Physical Anesthesia Plan  ASA: II  Anesthesia Plan: Epidural   Post-op Pain Management:    Induction:   Airway Management Planned:   Additional Equipment:   Intra-op Plan:   Post-operative Plan:   Informed Consent: I have reviewed the patients History and Physical, chart, labs and discussed the procedure including the risks, benefits and alternatives for the proposed anesthesia with the patient or authorized representative who has indicated his/her understanding and acceptance.     Plan Discussed with:   Anesthesia Plan Comments:         Anesthesia Quick Evaluation

## 2014-07-15 NOTE — Progress Notes (Signed)
Patient ID: Lori Pace, female   DOB: May 21, 1981, 33 y.o.   MRN: [id number]  Uncomfortable with ctx.  AFVSS gen NAD FHTs 120's, good variability, category 1 toco Q 2-73min  SVE 1+ per RN  D/W pt pain control  Anticipate SVD

## 2014-07-15 NOTE — Anesthesia Procedure Notes (Signed)
Epidural Patient location during procedure: OB  Staffing Anesthesiologist: Tyneshia Stivers R Performed by: anesthesiologist   Preanesthetic Checklist Completed: patient identified, pre-op evaluation, timeout performed, IV checked, risks and benefits discussed and monitors and equipment checked  Epidural Patient position: sitting Prep: site prepped and draped and DuraPrep Patient monitoring: heart rate Approach: midline Location: L3-L4 Injection technique: LOR air and LOR saline  Needle:  Needle type: Tuohy  Needle gauge: 17 G Needle length: 9 cm Needle insertion depth: 6 cm Catheter type: closed end flexible Catheter size: 19 Gauge Catheter at skin depth: 12 cm Test dose: negative  Assessment Sensory level: T8 Events: blood not aspirated, injection not painful, no injection resistance, negative IV test and no paresthesia  Additional Notes Reason for block:procedure for pain   

## 2014-07-16 ENCOUNTER — Encounter (HOSPITAL_COMMUNITY): Payer: Self-pay | Admitting: *Deleted

## 2014-07-16 LAB — ABO/RH: ABO/RH(D): O POS

## 2014-07-16 LAB — TYPE AND SCREEN
ABO/RH(D): O POS
Antibody Screen: NEGATIVE

## 2014-07-16 MED ORDER — MEASLES, MUMPS & RUBELLA VAC ~~LOC~~ INJ
0.5000 mL | INJECTION | Freq: Once | SUBCUTANEOUS | Status: DC
  Filled 2014-07-16: qty 0.5

## 2014-07-16 MED ORDER — METHYLERGONOVINE MALEATE 0.2 MG/ML IJ SOLN: 0.2000 mg | INTRAMUSCULAR | Status: DC | PRN

## 2014-07-16 MED ORDER — DIPHENHYDRAMINE HCL 25 MG PO CAPS: 25.0000 mg | ORAL_CAPSULE | Freq: Four times a day (QID) | ORAL | Status: DC | PRN

## 2014-07-16 MED ORDER — ONDANSETRON HCL 4 MG/2ML IJ SOLN: 4.0000 mg | INTRAMUSCULAR | Status: DC | PRN

## 2014-07-16 MED ORDER — PRENATAL MULTIVITAMIN CH
1.0000 | ORAL_TABLET | Freq: Every day | ORAL | Status: DC
  Administered 2014-07-17 – 2014-07-18 (×2): 1 via ORAL
  Filled 2014-07-16 (×2): qty 1

## 2014-07-16 MED ORDER — DIBUCAINE 1 % RE OINT: 1.0000 "application " | TOPICAL_OINTMENT | RECTAL | Status: DC | PRN

## 2014-07-16 MED ORDER — METHYLERGONOVINE MALEATE 0.2 MG/ML IJ SOLN
0.2000 mg | Freq: Once | INTRAMUSCULAR | Status: AC
  Administered 2014-07-16: 0.2 mg via INTRAMUSCULAR

## 2014-07-16 MED ORDER — BENZOCAINE-MENTHOL 20-0.5 % EX AERO
1.0000 "application " | INHALATION_SPRAY | CUTANEOUS | Status: DC | PRN
  Administered 2014-07-16: 1 via TOPICAL
  Filled 2014-07-16: qty 56

## 2014-07-16 MED ORDER — SENNOSIDES-DOCUSATE SODIUM 8.6-50 MG PO TABS
2.0000 | ORAL_TABLET | ORAL | Status: DC
  Administered 2014-07-17 (×2): 2 via ORAL
  Filled 2014-07-16 (×2): qty 2

## 2014-07-16 MED ORDER — OXYCODONE-ACETAMINOPHEN 5-325 MG PO TABS
1.0000 | ORAL_TABLET | ORAL | Status: DC | PRN
  Administered 2014-07-17: 1 via ORAL
  Filled 2014-07-16 (×2): qty 1

## 2014-07-16 MED ORDER — ZOLPIDEM TARTRATE 5 MG PO TABS: 5.0000 mg | ORAL_TABLET | Freq: Every evening | ORAL | Status: DC | PRN

## 2014-07-16 MED ORDER — LANOLIN HYDROUS EX OINT: TOPICAL_OINTMENT | CUTANEOUS | Status: DC | PRN

## 2014-07-16 MED ORDER — IBUPROFEN 600 MG PO TABS
600.0000 mg | ORAL_TABLET | Freq: Four times a day (QID) | ORAL | Status: DC
  Administered 2014-07-16 – 2014-07-18 (×8): 600 mg via ORAL
  Filled 2014-07-16 (×8): qty 1

## 2014-07-16 MED ORDER — METHYLERGONOVINE MALEATE 0.2 MG/ML IJ SOLN
INTRAMUSCULAR | Status: AC
  Administered 2014-07-16: 0.2 mg via INTRAMUSCULAR
  Filled 2014-07-16: qty 1

## 2014-07-16 MED ORDER — MAGNESIUM HYDROXIDE 400 MG/5ML PO SUSP: 30.0000 mL | ORAL | Status: DC | PRN

## 2014-07-16 MED ORDER — METHYLERGONOVINE MALEATE 0.2 MG PO TABS: 0.2000 mg | ORAL_TABLET | ORAL | Status: DC | PRN

## 2014-07-16 MED ORDER — ACETAMINOPHEN 500 MG PO TABS
1000.0000 mg | ORAL_TABLET | Freq: Once | ORAL | Status: AC
  Administered 2014-07-16: 1000 mg via ORAL
  Filled 2014-07-16: qty 2

## 2014-07-16 MED ORDER — WITCH HAZEL-GLYCERIN EX PADS: 1.0000 "application " | MEDICATED_PAD | CUTANEOUS | Status: DC | PRN

## 2014-07-16 MED ORDER — TETANUS-DIPHTH-ACELL PERTUSSIS 5-2.5-18.5 LF-MCG/0.5 IM SUSP: 0.5000 mL | Freq: Once | INTRAMUSCULAR | Status: DC

## 2014-07-16 MED ORDER — SIMETHICONE 80 MG PO CHEW: 80.0000 mg | CHEWABLE_TABLET | ORAL | Status: DC | PRN

## 2014-07-16 MED ORDER — ONDANSETRON HCL 4 MG PO TABS: 4.0000 mg | ORAL_TABLET | ORAL | Status: DC | PRN

## 2014-07-16 NOTE — Lactation Note (Signed)
This note was copied from the chart of Lori Sallyann Kinnaird. Lactation Consultation Note Initial visit at 4 hours of age.  Mom is attempting cradle latch with baby swaddled and far from body.  Encouraged STS with latch attempt, mom agrees.  Assisted with cross cradle hold for latch.  Assistance needed and baby only latched for a few suck and was asleep at the breast.  [facility] resources given and discussed.  Encouraged to feed with early cues on demand.  Early newborn behavior discussed.  Hand expression demonstrated with colostrum visible.  Mom to call for assist as needed.     Patient Name: Lori Pace [id number] Date: 07/16/2014 Reason for consult: Initial assessment   Maternal Data Has patient been taught Hand Expression?: Yes Does the patient have breastfeeding experience prior to this delivery?: No  Feeding Feeding Type: Breast Fed  LATCH Score/Interventions Latch: Repeated attempts needed to sustain latch, nipple held in mouth throughout feeding, stimulation needed to elicit sucking reflex. Intervention(s): Adjust position;Assist with latch;Breast massage;Breast compression  Audible Swallowing: None Intervention(s): Skin to skin;Hand expression  Type of Nipple: Everted at rest and after stimulation  Comfort (Breast/Nipple): Soft / non-tender     Hold (Positioning): Assistance needed to correctly position infant at breast and maintain latch. Intervention(s): Skin to skin;Position options;Support Pillows;Breastfeeding basics reviewed  LATCH Score: 6  Lactation Tools Discussed/Used     Consult Status Consult Status: Follow-up Date: 07/17/14 Follow-up type: In-patient    Shoptaw, Arvella Merles 07/16/2014, 6:12 PM

## 2014-07-16 NOTE — Progress Notes (Signed)
Patient ID: Lori Pace, female   DOB: 1981/07/30, 33 y.o.   MRN: [id number]  Comfortable with epidural  AFVSS gen NAD FHTs 125, mod var, category 1 toco irr, runs of ctx  SVE 8.9/90/+1-2  33yo G1P0 with prolonged labor Cont current management D/w pt possible LTCS

## 2014-07-16 NOTE — Progress Notes (Signed)
Patient ID: Lori Pace, female   DOB: [birth year], 33 y.o.   MRN: [id number]  Comfortable with epidural  AFVSS gen NAD, tired FHTs 125, mod variability toco q 2-4 SVE 7/90/+1-2 (caput)  D/W pt slow/no cervical change in 6+ hours, again reviewed r/b/a of LTCS.  Pt desires to wait 2-4 hours before having cesarean section.  D/W pt still has several centimeters to dilate and to push for delivery - voices understanding, wants to wait.    Will monitor and recheck in 2 hours

## 2014-07-16 NOTE — Progress Notes (Signed)
Admission temperature 101.2, skin very warm, dry. MD notified and updated. Plan to push fluids, give Tylenol and continue to monitor temperatures and report, as needed

## 2014-07-16 NOTE — Progress Notes (Signed)
Comfortable Afeb, VSS FHT- Cat I, ctx q 2 min on 30 mu/min pitocin VE-essentially complete/C/+1, vtx Will have her push and monitor progress

## 2014-07-16 NOTE — Progress Notes (Signed)
Patient ID: Lori Pace, female   DOB: Jun 17, 1981, 33 y.o.   MRN: [id number]  Per RN, changed to 8cm.  Will continue to monitor.    120's mod var toco Q 2-31min  Comfortable with epidural

## 2014-07-17 LAB — CBC
HCT: 24.1 % — ABNORMAL LOW (ref 36.0–46.0)
HEMOGLOBIN: 8.2 g/dL — AB (ref 12.0–15.0)
MCH: 29.6 pg (ref 26.0–34.0)
MCHC: 34 g/dL (ref 30.0–36.0)
MCV: 87 fL (ref 78.0–100.0)
Platelets: 168 10*3/uL (ref 150–400)
RBC: 2.77 MIL/uL — AB (ref 3.87–5.11)
RDW: 14.3 % (ref 11.5–15.5)
WBC: 18.6 10*3/uL — ABNORMAL HIGH (ref 4.0–10.5)

## 2014-07-17 NOTE — Lactation Note (Signed)
This note was copied from the chart of Lori Pace. Lactation Consultation Note  Patient Name: Lori Pace ZOXWR'U Date: 07/17/2014 Reason for consult: Follow-up assessment of this mom and baby, now 28 hours postpartum.  Baby had some initial latching difficulty and mom was assisted using a NS but she reports that baby has been latching better today and she is not using NS.  Mom denies any breastfeeding concerns.  LC encouraged her to call for breastfeeding help as needed and to continue ad lib cue feeding.   Maternal Data    Feeding Feeding Type: Breast Fed Length of feed: 15 min  LATCH Score/Interventions           LATCH scores=8 today per RN assessment           Lactation Tools Discussed/Used   Cue feedings  Consult Status Consult Status: Follow-up Date: 07/18/14 Follow-up type: In-patient    Warrick Parisian [facility] 07/17/2014, 6:08 PM

## 2014-07-17 NOTE — Progress Notes (Signed)
PPD #1 No problems Afeb-temp to 101.2 right after delivery, VSS Fundus firm, NT at U-1 Continue routine postpartum care, monitor temp and start abx if gets fever.  She has not paid for circumcision, deciding on whether to do here or in the office

## 2014-07-17 NOTE — Addendum Note (Signed)
Addendum created 07/17/14 1529 by Renford Dills, CRNA   Modules edited: Charges VN, Notes Section   Notes Section:  File: [id number]

## 2014-07-17 NOTE — Anesthesia Postprocedure Evaluation (Signed)
  Anesthesia Post-op Note  Patient: Lori Pace  Procedure(s) Performed: * No procedures listed *  Patient Location: Mother/Baby  Anesthesia Type:Epidural  Level of Consciousness: awake  Airway and Oxygen Therapy: Patient Spontanous Breathing  Post-op Pain: mild  Post-op Assessment: Patient's Cardiovascular Status Stable and Respiratory Function Stable  Post-op Vital Signs: stable  Last Vitals:  Filed Vitals:   07/17/14 0645  BP: 107/69  Pulse: 79  Temp: 36.5 C  Resp:     Complications: No apparent anesthesia complications

## 2014-07-18 MED ORDER — IBUPROFEN 600 MG PO TABS: 600.0000 mg | ORAL_TABLET | Freq: Four times a day (QID) | ORAL | Status: DC

## 2014-07-18 MED ORDER — OXYCODONE-ACETAMINOPHEN 5-325 MG PO TABS: 1.0000 | ORAL_TABLET | ORAL | Status: DC | PRN

## 2014-07-18 NOTE — Lactation Note (Signed)
This note was copied from the chart of Lori Pace. Lactation Consultation Note  Patient Name: Lori Pace ZOXWR'U Date: 07/18/2014 Reason for consult: Follow-up assessment Mom had baby latched when LC arrived. Baby tucking bottom lip. LC assisted Mom with re-latching baby to obtain more depth and keep lip untucked. Baby has been cluster feeding. D/C delay due to bili levels just below photo therapy range. Bili to be re-checked this afternoon per Mom. Baby demonstrates a good rhythmic suck, some swallows noted. Advised Mom that if bili level rises requiring photo therapy to call for [facility] assist and consider supplements of either EBM or formula. Advised pumping may be needed if this occurs. Will await bili levels this afternoon to see if we need to modify feeding plan. Baby is now 84 hours old and has had 5 voids/7 stools. Engorgement care reviewed with Mom if needed. Advised of OP services and support group. Encouraged to call for assist as needed.  Maternal Data    Feeding Feeding Type: Breast Fed Length of feed: 10 min  LATCH Score/Interventions Latch: Grasps breast easily, tongue down, lips flanged, rhythmical sucking. Intervention(s): Assist with latch;Adjust position;Breast massage;Breast compression  Audible Swallowing: A few with stimulation  Type of Nipple: Everted at rest and after stimulation  Comfort (Breast/Nipple): Filling, red/small blisters or bruises, mild/mod discomfort  Problem noted: Mild/Moderate discomfort Interventions (Mild/moderate discomfort): Hand massage;Hand expression  Hold (Positioning): Assistance needed to correctly position infant at breast and maintain latch. Intervention(s): Breastfeeding basics reviewed;Support Pillows;Position options;Skin to skin  LATCH Score: 7  Lactation Tools Discussed/Used Tools: Pump Breast pump type: Manual   Consult Status Consult Status: Follow-up Date: 07/18/14 Follow-up type: In-patient    Alfred Levins 07/18/2014, 10:48 AM

## 2014-07-18 NOTE — Discharge Summary (Signed)
Obstetric Discharge Summary Reason for Admission: rupture of membranes Prenatal Procedures: none Intrapartum Procedures: vacuum Postpartum Procedures: none Complications-Operative and Postpartum: 1st degree perineal laceration Hemoglobin  Date Value Ref Range Status  07/17/2014 8.2* 12.0 - 15.0 g/dL Final     HCT  Date Value Ref Range Status  07/17/2014 24.1* 36.0 - 46.0 % Final    Physical Exam:  General: alert Lochia: appropriate Uterine Fundus: firm   Discharge Diagnoses: Term Pregnancy-delivered  Discharge Information: Date: 07/18/2014 Activity: pelvic rest Diet: routine Medications: Ibuprofen and Percocet Condition: stable Instructions: refer to practice specific booklet Discharge to: home Follow-up Information   Follow up with Sherian Rein, MD On 08/27/2014. (08/27/2014 @ 1345)    Specialty:  Obstetrics and Gynecology   Contact information:   [address] [place] [address] [phone]       Follow up with Obi Scrima D, MD. Schedule an appointment as soon as possible for a visit in 6 weeks.   Specialty:  Obstetrics and Gynecology   Contact information:   [address] [place] [address] [phone]       Newborn Data: Live born female  Birth Weight: 6 lb 12.6 oz (3080 g) APGAR: 7, 8  Home with mother.  Lotta Frankenfield D 07/18/2014, 8:24 AM

## 2014-07-18 NOTE — Progress Notes (Signed)
PPD #2 Doing well Afeb, VSS Fundus firm D/c home 

## 2014-07-18 NOTE — Discharge Instructions (Signed)
As per discharge pamphlet °

## 2014-07-18 NOTE — Lactation Note (Signed)
This note was copied from the chart of Lori Pamala Hayman. Lactation Consultation Note  Patient Name: Lori Pace [id number] Date: 07/18/2014 Reason for consult: Follow-up assessment Baby asleep, STS on Mom. Mom's breasts are filling. Baby cluster feeding this morning and Mom is tired. LC reviewed cluster feeding with Mom and advised if Mom not able to get rest with cluster feeding to consider post pumping and supplementing baby with EBM since her breasts are filling. Mom would prefer not to use formula at this time and LC advised she needs to use her breast milk to prevent engorgement and protect milk supply. Advised Mom to monitor voids/stools. Peds f/u tomorrow. Advised to call for questions or concerns.   Maternal Data    Feeding Feeding Type: Breast Fed Length of feed: 20 min  LATCH Score/Interventions                      Lactation Tools Discussed/Used     Consult Status Consult Status: Complete Date: 07/18/14 Follow-up type: In-patient    Alfred Levins 07/18/2014, 2:47 PM

## 2014-09-17 ENCOUNTER — Encounter (HOSPITAL_COMMUNITY): Payer: Self-pay | Admitting: *Deleted

## 2014-10-01 IMAGING — US US PELVIS COMPLETE
1 series · 13 of 25 positions shown · non-contrast
Comparison: CT from earlier the same day.

CLINICAL DATA: Right lower quadrant pain.  Rule out torsion.

TRANSABDOMINAL AND TRANSVAGINAL ULTRASOUND OF PELVIS
DOPPLER ULTRASOUND OF OVARIES
TECHNIQUE: Both transabdominal and transvaginal ultrasound
examinations of the pelvis were performed. Transabdominal technique
was performed for global imaging of the pelvis including uterus,
ovaries, adnexal regions, and pelvic cul-de-sac.
It was necessary to proceed with endovaginal exam following the
transabdominal exam to visualize the ovaries and uterus..
Color and duplex Doppler ultrasound was utilized to evaluate blood
flow to the ovaries.

[Series 1: us pelvis complete · 0.17mm/px · 13 of 70 slices shown]
[im 1/70]
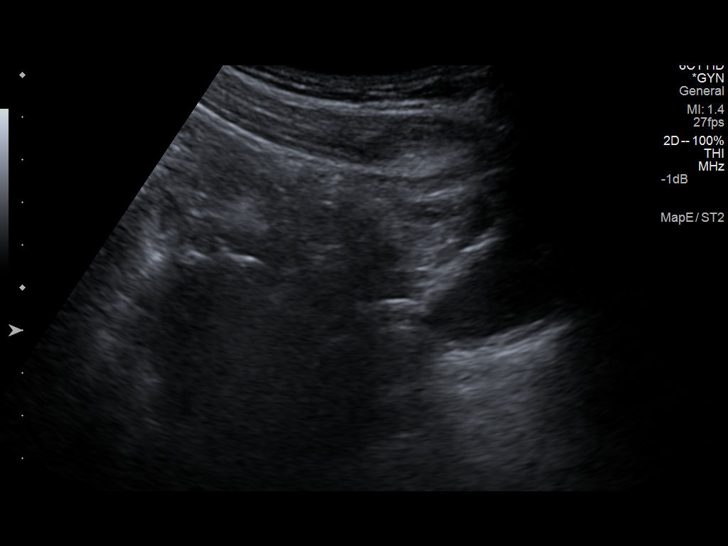
[im 6/70]
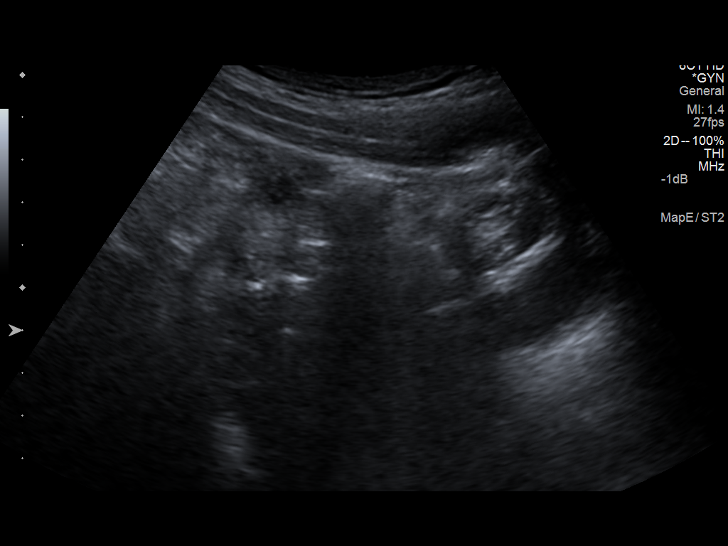
[im 12/70]
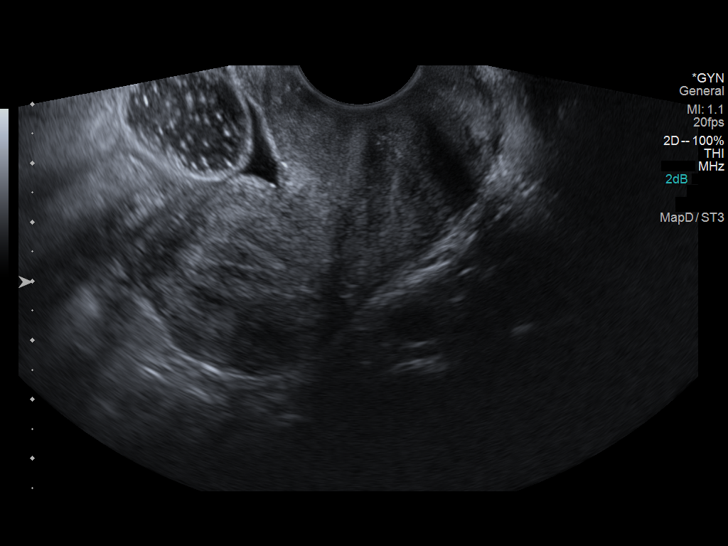
[im 18/70]
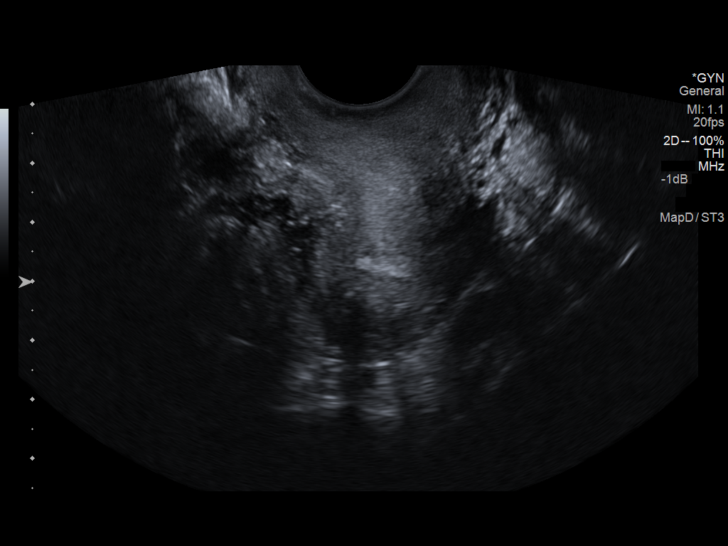
[im 24/70]
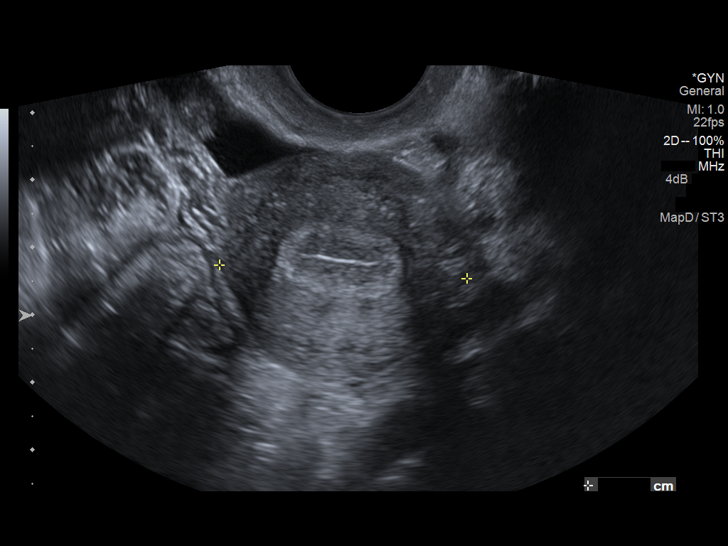
[im 29/70]
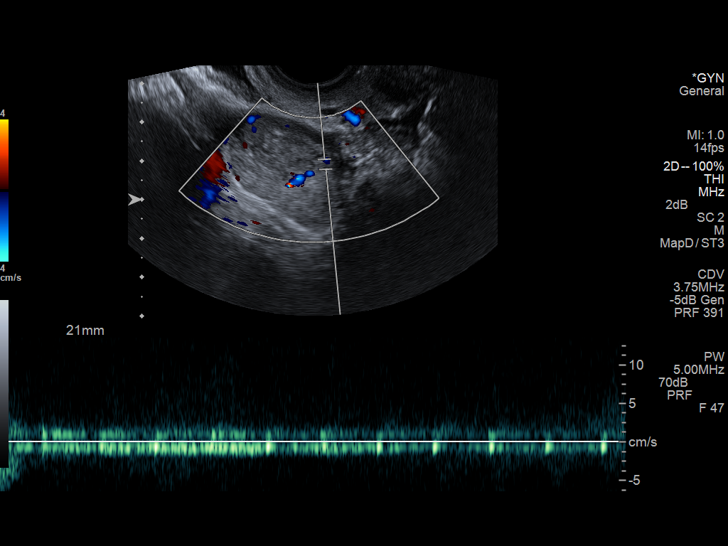
[im 35/70]
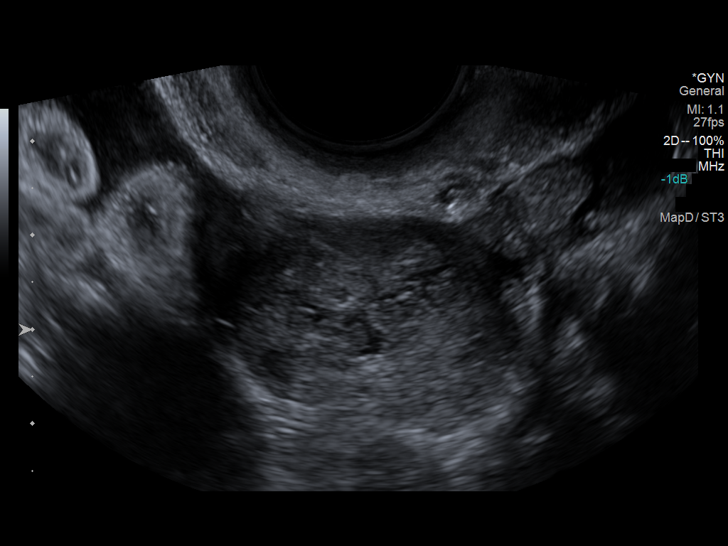
[im 41/70]
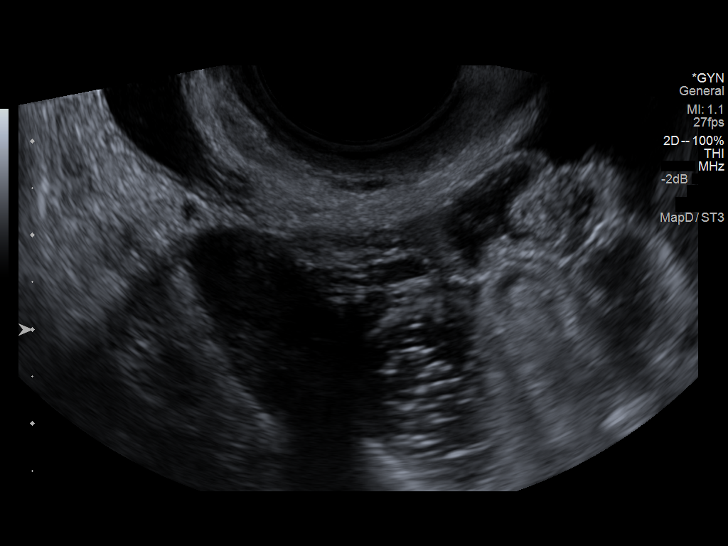
[im 47/70]
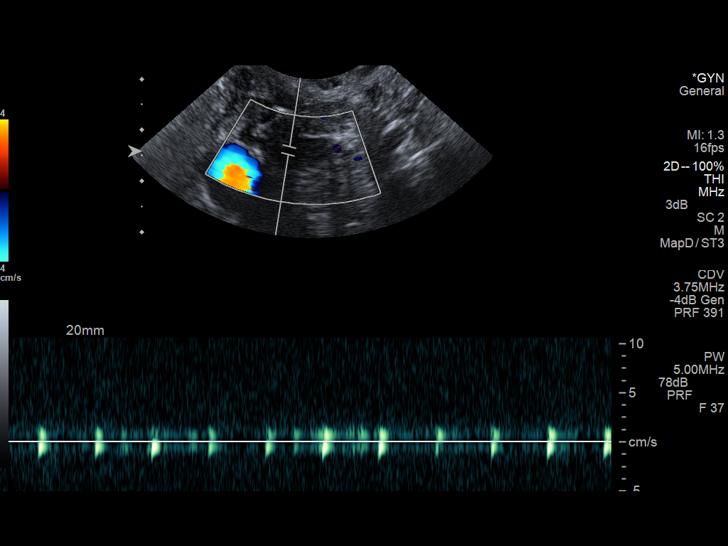
[im 52/70]
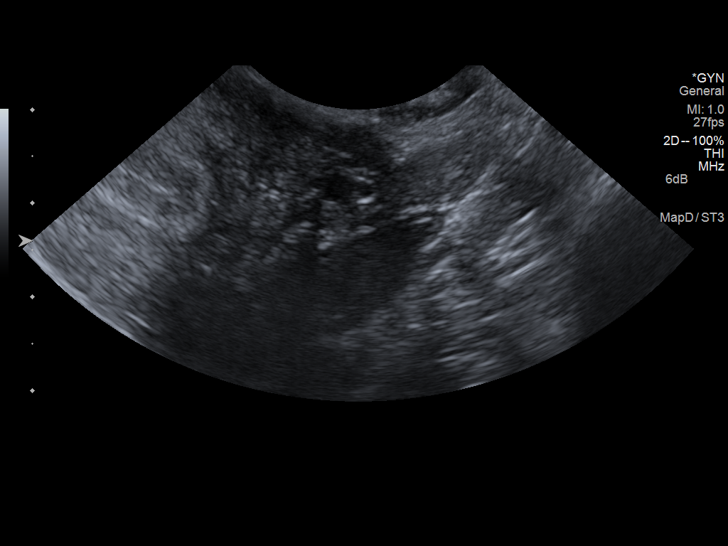
[im 58/70]
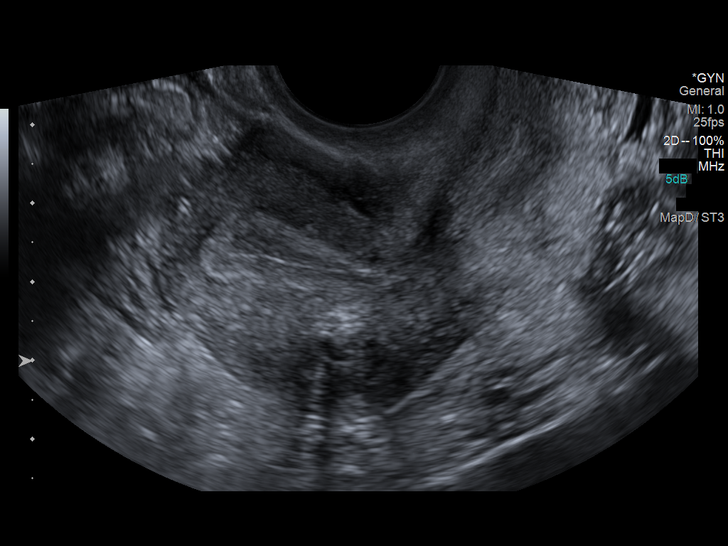
[im 64/70]
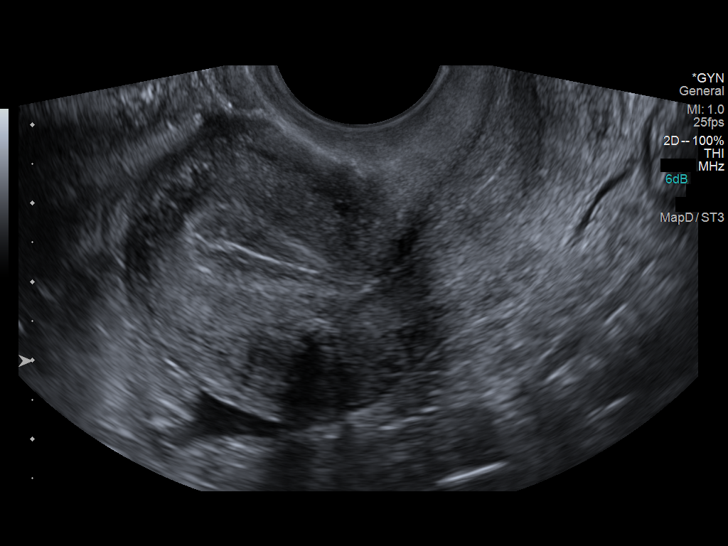
[im 70/70]
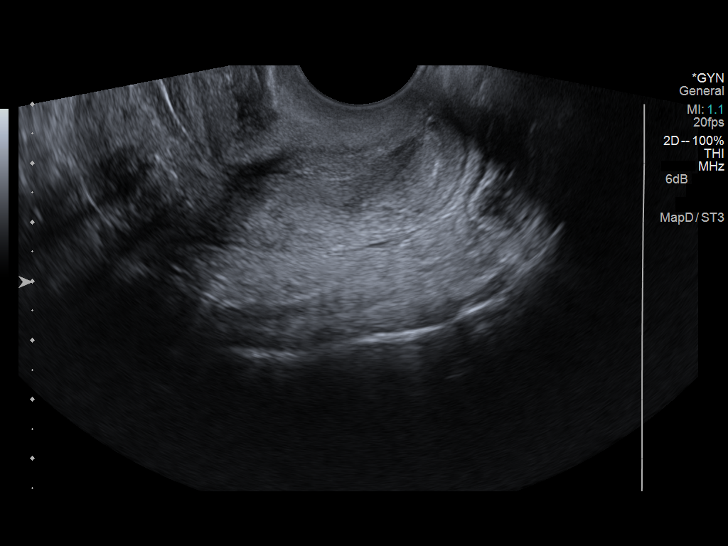

[13 of 25 positions shown; findings below may reference images not displayed]

FINDINGS

Uterus:  1.4 cm heterogeneous echotexture mass in the posterior
body, intramural.  Normal sized, 7 x 3 x 6 cm.

Endometrium:  9 mm inhomogeneous.

Right ovary: Normal in appearance, 3.2 x 2.2 x 2.2 cm. Size
differential related to corpus luteal cyst, measuring 16 mm
diameter.

Left ovary: Normal in appearance, 1.7 x 0.8 x 1.3 cm.

Pulsed Doppler evaluation demonstrates normal low-resistance
arterial and venous waveforms in both ovaries.

Other:  Moderate free fluid in the pelvis, likely reactive to the
patient's colitis.
IMPRESSION: 1.  No ovarian torsion or other acute abnormality.
2.  1.4 cm fibroid in the posterior uterine body.

## 2015-01-31 ENCOUNTER — Encounter (HOSPITAL_COMMUNITY): Payer: Self-pay | Admitting: Emergency Medicine

## 2015-01-31 ENCOUNTER — Emergency Department (HOSPITAL_COMMUNITY)
Admission: EM | Admit: 2015-01-31 | Discharge: 2015-01-31 | Disposition: A | Discharge status: Home / Self Care | Payer: Medicaid Other | Attending: Emergency Medicine | Admitting: Emergency Medicine

## 2015-01-31 ENCOUNTER — Emergency Department (HOSPITAL_COMMUNITY): Payer: Medicaid Other

## 2015-01-31 DIAGNOSIS — O209 Hemorrhage in early pregnancy, unspecified: Secondary | ICD-10-CM | POA: Diagnosis not present

## 2015-01-31 DIAGNOSIS — Z3A09 9 weeks gestation of pregnancy: Secondary | ICD-10-CM | POA: Diagnosis not present

## 2015-01-31 DIAGNOSIS — Z91040 Latex allergy status: Secondary | ICD-10-CM | POA: Insufficient documentation

## 2015-01-31 DIAGNOSIS — Z791 Long term (current) use of non-steroidal anti-inflammatories (NSAID): Secondary | ICD-10-CM | POA: Insufficient documentation

## 2015-01-31 LAB — URINALYSIS, ROUTINE W REFLEX MICROSCOPIC
Bilirubin Urine: NEGATIVE
Glucose, UA: NEGATIVE mg/dL
Hgb urine dipstick: NEGATIVE
KETONES UR: NEGATIVE mg/dL
Leukocytes, UA: NEGATIVE
Nitrite: NEGATIVE
Protein, ur: NEGATIVE mg/dL
SPECIFIC GRAVITY, URINE: 1.011 (ref 1.005–1.030)
Urobilinogen, UA: 0.2 mg/dL (ref 0.0–1.0)
pH: 6.5 (ref 5.0–8.0)

## 2015-01-31 LAB — ABO/RH: ABO/RH(D): O POS

## 2015-01-31 LAB — CBC
HCT: 30.5 % — ABNORMAL LOW (ref 36.0–46.0)
HEMOGLOBIN: 10.4 g/dL — AB (ref 12.0–15.0)
MCH: 29.1 pg (ref 26.0–34.0)
MCHC: 34.1 g/dL (ref 30.0–36.0)
MCV: 85.2 fL (ref 78.0–100.0)
Platelets: 310 10*3/uL (ref 150–400)
RBC: 3.58 MIL/uL — ABNORMAL LOW (ref 3.87–5.11)
RDW: 13.4 % (ref 11.5–15.5)
WBC: 7.9 10*3/uL (ref 4.0–10.5)

## 2015-01-31 LAB — HCG, QUANTITATIVE, PREGNANCY: HCG, BETA CHAIN, QUANT, S: 92998 m[IU]/mL — AB (ref <5)

## 2015-01-31 MED ORDER — PRENATAL 27-0.8 MG PO TABS: 1.0000 | ORAL_TABLET | Freq: Every day | ORAL | Status: DC

## 2015-01-31 MED ORDER — ACETAMINOPHEN 500 MG PO TABS
1000.0000 mg | ORAL_TABLET | Freq: Once | ORAL | Status: DC
  Filled 2015-01-31: qty 2

## 2015-01-31 NOTE — ED Notes (Signed)
Pt in US

## 2015-01-31 NOTE — ED Provider Notes (Signed)
CSN: [id number]     Arrival date & time 01/31/15  1050 History   First MD Initiated Contact with Patient 01/31/15 1200     Chief Complaint  Patient presents with  . Vaginal Bleeding  . Possible Pregnancy     (Consider location/radiation/quality/duration/timing/severity/associated sxs/prior Treatment) HPI Comments: Patient is a G47P1 34 yo F presenting to the ED for evaluation of one week of pelvic cramping and pain with associated spotting. She states her last missed a period was sometime in December, does not remember when. She states she had an at-home positive pregnancy test February 28th. Patient is seen at [facility]. She has not had an ultrasound confirming intrauterine pregnancy yet.   Patient is a 34 y.o. female presenting with vaginal bleeding and pregnancy problem.  Vaginal Bleeding Possible Pregnancy    Past Medical History  Diagnosis Date  . Headache(784.0)   . Anemia   . ROM (rupture of membranes), premature 07/14/2014   Past Surgical History  Procedure Laterality Date  . No past surgeries     Family History  Problem Relation Age of Onset  . Asthma Sister   . Cancer Maternal Aunt   . Diabetes Paternal Grandmother    History  Substance Use Topics  . Smoking status: Never Smoker   . Smokeless tobacco: Never Used  . Alcohol Use: No   OB History    Gravida Para Term Preterm AB TAB SAB Ectopic Multiple Living   Review of Systems  Genitourinary: Positive for vaginal bleeding and pelvic pain.  All other systems reviewed and are negative.     Allergies  Latex  Home Medications   Prior to Admission medications   Medication Sig Start Date End Date Taking? Authorizing Provider  ibuprofen (ADVIL,MOTRIN) 600 MG tablet Take 1 tablet (600 mg total) by mouth every 6 (six) hours. 07/18/14  Yes Lavina Hamman, MD  oxyCODONE-acetaminophen (PERCOCET/ROXICET) 5-325 MG per tablet Take 1-2 tablets by mouth every 4 (four) hours as needed for  severe pain (moderate - severe pain). Patient not taking: Reported on 01/31/2015 07/18/14   Lavina Hamman, MD  Prenatal Vit-Fe Fumarate-FA (MULTIVITAMIN-PRENATAL) 27-0.8 MG TABS tablet Take 1 tablet by mouth daily at 12 noon. 01/31/15   Lakira Ogando, PA-C   BP 113/65 mmHg  Pulse 67  Temp(Src) 97.7 F (36.5 C) (Oral)  Resp 15  Ht  (1.549 m)  Wt 132 lb (59.875 kg)  BMI 24.95 kg/m2  SpO2 100%  LMP 10/16/2014 (Within Months) Physical Exam  Constitutional: She is oriented to person, place, and time. She appears well-developed and well-nourished. No distress.  HENT:  Head: Normocephalic and atraumatic.  Right Ear: External ear normal.  Left Ear: External ear normal.  Nose: Nose normal.  Mouth/Throat: Oropharynx is clear and moist.  Eyes: Conjunctivae are normal.  Neck: Normal range of motion. Neck supple.  Cardiovascular: Normal rate, regular rhythm and normal heart sounds.   Pulmonary/Chest: Effort normal and breath sounds normal.  Abdominal: Soft. Bowel sounds are normal. There is tenderness. There is no rigidity, no rebound and no guarding.    Musculoskeletal: Normal range of motion.  Neurological: She is alert and oriented to person, place, and time.  Skin: Skin is warm and dry. She is not diaphoretic.  Psychiatric: She has a normal mood and affect.  Nursing note and vitals reviewed.   ED Course  Procedures (including critical care time) Medications - No data to  display  Labs Review Labs Reviewed  CBC - Abnormal; Notable for the following:    RBC 3.58 (*)    Hemoglobin 10.4 (*)    HCT 30.5 (*)    All other components within normal limits  HCG, QUANTITATIVE, PREGNANCY - Abnormal; Notable for the following:    hCG, Beta Chain, Quant, [place] [id number] (*)    All other components within normal limits  URINALYSIS, ROUTINE W REFLEX MICROSCOPIC  ABO/RH    Imaging Review [place] Ob Comp Less 14 Wks  01/31/2015   CLINICAL DATA:  Vaginal bleeding.  Pregnancy.  EXAM: OBSTETRIC  <14 WK US AND TRANSVAGINAL OB US  TECHNIQUE: Both transabdominal and transvaginal ultrasound examinations were performed for complete evaluation of the gestation as well as the maternal uterus, adnexal regions, and pelvic cul-de-sac. Transvaginal technique was performed to assess early pregnancy.  COMPARISON:  None.  FINDINGS: Intrauterine gestational sac: Visualized/normal in shape.  Yolk sac:  Present  Embryo:  Present  Cardiac Activity: Present  Heart Rate: 169  bpm  MSD:   mm    w     d  CRL:  24.1  mm   9 w   1 d                  US EDC: 09/04/2015  Maternal uterus/adnexae: Negative for subchorionic hemorrhage. Ovaries are normal.  16 mm uterine fibroid posteriorly  IMPRESSION: Normal intrauterine living pregnancy 9 weeks 1 day   Electronically Signed   By: Marlan Palauharles  Clark M.D.   On: 01/31/2015 14:57   [place] Ob Transvaginal  01/31/2015   CLINICAL DATA:  Vaginal bleeding.  Pregnancy.  EXAM: OBSTETRIC <14 WK US AND TRANSVAGINAL OB US  TECHNIQUE: Both transabdominal and transvaginal ultrasound examinations were performed for complete evaluation of the gestation as well as the maternal uterus, adnexal regions, and pelvic cul-de-sac. Transvaginal technique was performed to assess early pregnancy.  COMPARISON:  None.  FINDINGS: Intrauterine gestational sac: Visualized/normal in shape.  Yolk sac:  Present  Embryo:  Present  Cardiac Activity: Present  Heart Rate: 169  bpm  MSD:   mm    w     d  CRL:  24.1  mm   9 w   1 d                  US EDC: 09/04/2015  Maternal uterus/adnexae: Negative for subchorionic hemorrhage. Ovaries are normal.  16 mm uterine fibroid posteriorly  IMPRESSION: Normal intrauterine living pregnancy 9 weeks 1 day   Electronically Signed   By: Marlan Palauharles  Clark M.D.   On: 01/31/2015 14:57     EKG Interpretation None      MDM   Final diagnoses:  Vaginal bleeding in pregnancy, first trimester    Filed Vitals:   01/31/15 1519  BP:   Pulse: 67  Temp:   Resp:    Afebrile, NAD,  non-toxic appearing, AAOx4.  I have reviewed nursing notes, vital signs, and all appropriate lab and imaging results for this patient.  Patient with pelvic cramping and vaginal spotting in first trimester pregnancy. Patient is ABO positive, not a candidate for Rhogam. US reveals confirmed live intrauterine pregnancy. Discussed possibilities of vaginal bleeding in 1st trimester pregnancy with patient including but not limited to threatened or inevitable abortion. Advised Ob/Gyn follow up for recheck. Return precautions discussed. Patient is agreeable to plan. Patient is stable at time of discharge    Francee PiccoloJennifer Minoru Chap, PA-C 02/01/15 1747  Arby BarretteMarcy Pfeiffer, MD  02/02/15 1548 

## 2015-01-31 NOTE — Discharge Instructions (Signed)
Please follow up with your Ob[facility]Gyn or [facility] to schedule a follow up appointment. You may take Tylenol for any pain or discomfort. Please read all discharge instructions and return precautions.    Vaginal Bleeding During Pregnancy, First Trimester A small amount of bleeding (spotting) from the vagina is relatively common in early pregnancy. It usually stops on its own. Various things may cause bleeding or spotting in early pregnancy. Some bleeding may be related to the pregnancy, and some may not. In most cases, the bleeding is normal and is not a problem. However, bleeding can also be a sign of something serious. Be sure to tell your health care provider about any vaginal bleeding right away. Some possible causes of vaginal bleeding during the first trimester include:  Infection or inflammation of the cervix.  Growths (polyps) on the cervix.  Miscarriage or threatened miscarriage.  Pregnancy tissue has developed outside of the uterus and in a fallopian tube (tubal pregnancy).  Tiny cysts have developed in the uterus instead of pregnancy tissue (molar pregnancy). HOME CARE INSTRUCTIONS  Watch your condition for any changes. The following actions may help to lessen any discomfort you are feeling:  Follow your health care provider's instructions for limiting your activity. If your health care provider orders bed rest, you may need to stay in bed and only get up to use the bathroom. However, your health care provider may allow you to continue light activity.  If needed, make plans for someone to help with your regular activities and responsibilities while you are on bed rest.  Keep track of the number of pads you use each day, how often you change pads, and how soaked (saturated) they are. Write this down.  Do not use tampons. Do not douche.  Do not have sexual intercourse or orgasms until approved by your health care provider.  If you pass any tissue from your vagina, save the  tissue so you can show it to your health care provider.  Only take over-the-counter or prescription medicines as directed by your health care provider.  Do not take aspirin because it can make you bleed.  Keep all follow-up appointments as directed by your health care provider. SEEK MEDICAL CARE IF:  You have any vaginal bleeding during any part of your pregnancy.  You have cramps or labor pains.  You have a fever, not controlled by medicine. SEEK IMMEDIATE MEDICAL CARE IF:   You have severe cramps in your back or belly (abdomen).  You pass large clots or tissue from your vagina.  Your bleeding increases.  You feel light-headed or weak, or you have fainting episodes.  You have chills.  You are leaking fluid or have a gush of fluid from your vagina.  You pass out while having a bowel movement. MAKE SURE YOU:  Understand these instructions.  Will watch your condition.  Will get help right away if you are not doing well or get worse. Document Released: 08/12/2005 Document Revised: 11/07/2013 Document Reviewed: 07/10/2013 [facility] Patient Information 2015 [employer] [place], [place]. This information is not intended to replace advice given to you by your health care provider. Make sure you discuss any questions you have with your health care provider.

## 2015-01-31 NOTE — ED Notes (Signed)
Pt reports LMP was sometime in December. Had positive home pregnancy test. Feb 28th. Reports for the last week has been having spotting. Pt reports cramping and abdominal pain mostly on right side for the last week.

## 2015-02-13 ENCOUNTER — Inpatient Hospital Stay (HOSPITAL_COMMUNITY)
Admission: AD | Admit: 2015-02-13 | Discharge: 2015-02-13 | Disposition: A | Discharge status: Home / Self Care | Payer: Medicaid Other | Source: Ambulatory Visit | Attending: Obstetrics and Gynecology | Admitting: Obstetrics and Gynecology

## 2015-02-13 ENCOUNTER — Encounter (HOSPITAL_COMMUNITY): Payer: Self-pay

## 2015-02-13 DIAGNOSIS — R102 Pelvic and perineal pain: Secondary | ICD-10-CM | POA: Insufficient documentation

## 2015-02-13 DIAGNOSIS — N760 Acute vaginitis: Secondary | ICD-10-CM | POA: Diagnosis not present

## 2015-02-13 DIAGNOSIS — O9989 Other specified diseases and conditions complicating pregnancy, childbirth and the puerperium: Secondary | ICD-10-CM

## 2015-02-13 DIAGNOSIS — R109 Unspecified abdominal pain: Secondary | ICD-10-CM | POA: Diagnosis present

## 2015-02-13 DIAGNOSIS — B9689 Other specified bacterial agents as the cause of diseases classified elsewhere: Secondary | ICD-10-CM

## 2015-02-13 DIAGNOSIS — O26899 Other specified pregnancy related conditions, unspecified trimester: Secondary | ICD-10-CM

## 2015-02-13 DIAGNOSIS — O23591 Infection of other part of genital tract in pregnancy, first trimester: Secondary | ICD-10-CM | POA: Diagnosis not present

## 2015-02-13 DIAGNOSIS — Z3A11 11 weeks gestation of pregnancy: Secondary | ICD-10-CM | POA: Diagnosis not present

## 2015-02-13 LAB — WET PREP, GENITAL
Trich, Wet Prep: NONE SEEN
Yeast Wet Prep HPF POC: NONE SEEN

## 2015-02-13 LAB — URINALYSIS, ROUTINE W REFLEX MICROSCOPIC
BILIRUBIN URINE: NEGATIVE
Glucose, UA: NEGATIVE mg/dL
Hgb urine dipstick: NEGATIVE
Ketones, ur: NEGATIVE mg/dL
LEUKOCYTES UA: NEGATIVE
Nitrite: NEGATIVE
PH: 5.5 (ref 5.0–8.0)
Protein, ur: NEGATIVE mg/dL
Specific Gravity, Urine: 1.02 (ref 1.005–1.030)
UROBILINOGEN UA: 0.2 mg/dL (ref 0.0–1.0)

## 2015-02-13 MED ORDER — METRONIDAZOLE 500 MG PO TABS: 500.0000 mg | ORAL_TABLET | Freq: Two times a day (BID) | ORAL | Status: DC

## 2015-02-13 NOTE — MAU Provider Note (Signed)
History     CSN: [id number]  Arrival date and time: 02/13/15 2111   First Provider Initiated Contact with Patient 02/13/15 2145      Chief Complaint  Patient presents with  . Abdominal Pain   HPI  Lori Pace is a 34 y.o. G2P1001 at 1424w0d who presents to MAU today with complaint of bilateral lower abdominal pain since 1845 today. The patient states that pain comes and goes. It is aggravated by change in position. She denies vaginal bleeding, discharge, UTI symptoms, fever, vomiting, diarrhea or constipation. She states some mild nausea.   OB History    Gravida Para Term Preterm AB TAB SAB Ectopic Multiple Living   2 1 1       1       Past Medical History  Diagnosis Date  . Headache(784.0)   . Anemia   . ROM (rupture of membranes), premature 07/14/2014    Past Surgical History  Procedure Laterality Date  . No past surgeries      Family History  Problem Relation Age of Onset  . Asthma Sister   . Cancer Maternal Aunt   . Diabetes Paternal Grandmother     History  Substance Use Topics  . Smoking status: Never Smoker   . Smokeless tobacco: Never Used  . Alcohol Use: No    Allergies:  Allergies  Allergen Reactions  . Latex Itching and Swelling    Prescriptions prior to admission  Medication Sig Dispense Refill Last Dose  . Prenatal Vit-Fe Fumarate-FA (MULTIVITAMIN-PRENATAL) 27-0.8 MG TABS tablet Take 1 tablet by mouth daily at 12 noon. 30 each 0 02/13/2015 at Unknown time  . ibuprofen (ADVIL,MOTRIN) 600 MG tablet Take 1 tablet (600 mg total) by mouth every 6 (six) hours. (Patient not taking: Reported on 02/13/2015) 30 tablet 0 Past Week at Unknown time  . oxyCODONE-acetaminophen (PERCOCET/ROXICET) 5-325 MG per tablet Take 1-2 tablets by mouth every 4 (four) hours as needed for severe pain (moderate - severe pain). (Patient not taking: Reported on 01/31/2015) 20 tablet 0 more than one month    Review of Systems  Constitutional: Negative for fever and  malaise/fatigue.  Gastrointestinal: Positive for nausea and abdominal pain. Negative for vomiting, diarrhea and constipation.  Genitourinary: Negative for dysuria, urgency and frequency.       Neg - vaginal bleeding, discharge   Physical Exam   Blood pressure 103/60, pulse 81, temperature 98.6 F (37 C), temperature source Oral, resp. rate 20, height 5\' 1"  (1.549 m), weight 137 lb (62.143 kg), last menstrual period 10/16/2014, SpO2 100 %, not currently breastfeeding.  Physical Exam  Constitutional: She is oriented to person, place, and time. She appears well-developed and well-nourished. No distress.  HENT:  Head: Normocephalic.  Cardiovascular: Normal rate.   Respiratory: Effort normal.  GI: Soft. Bowel sounds are normal. She exhibits no distension and no mass. There is tenderness (very mild tenderness to palpation of the lower abdomen). There is no rebound and no guarding.  Genitourinary: Uterus is enlarged (appropriate for GA). Uterus is not tender. Cervix exhibits no motion tenderness, no discharge and no friability. Right adnexum displays no mass and no tenderness. Left adnexum displays no mass and no tenderness. No bleeding in the vagina. Vaginal discharge (scant thin, white discharge noted) found.  Cervix: closed, thick  Neurological: She is alert and oriented to person, place, and time.  Skin: Skin is warm and dry. No erythema.  Psychiatric: She has a normal mood and affect.   Results for  orders placed or performed during the hospital encounter of 02/13/15 (from the past 24 hour(s))  Urinalysis, Routine w reflex microscopic     Status: None   Collection Time: 02/13/15  9:27 PM  Result Value Ref Range   Color, Urine YELLOW YELLOW   APPearance CLEAR CLEAR   Specific Gravity, Urine 1.020 1.005 - 1.030   pH 5.5 5.0 - 8.0   Glucose, UA NEGATIVE NEGATIVE mg/dL   Hgb urine dipstick NEGATIVE NEGATIVE   Bilirubin Urine NEGATIVE NEGATIVE   Ketones, ur NEGATIVE NEGATIVE mg/dL    Protein, ur NEGATIVE NEGATIVE mg/dL   Urobilinogen, UA 0.2 0.0 - 1.0 mg/dL   Nitrite NEGATIVE NEGATIVE   Leukocytes, UA NEGATIVE NEGATIVE  Wet prep, genital     Status: Abnormal   Collection Time: 02/13/15  9:50 PM  Result Value Ref Range   Yeast Wet Prep HPF POC NONE SEEN NONE SEEN   Trich, Wet Prep NONE SEEN NONE SEEN   Clue Cells Wet Prep HPF POC FEW (A) NONE SEEN   WBC, Wet Prep HPF POC FEW (A) NONE SEEN    MAU Course  Procedures None  MDM FHR 170 bpm with doppler UA, wet prep, GC/Chlamydia, RPR and HIV today  Assessment and Plan  A: SIUP at [redacted]w[redacted]d Round ligament pain Bacterial vaginosis  P: Discharge home Rx for Flagyl given to patient Discussed use of Tylenol, warm bath/shower and abdominal binder for pain Patient advised to keep appointment to start prenatal care with [facility] as scheduled Patient may return to MAU as needed or if her condition were to change or worsen   Marny Lowenstein, PA-C  02/13/2015, 10:40 PM

## 2015-02-13 NOTE — MAU Note (Signed)
Abdominal cramping since 745 pm tonight. Denies vaginal bleeding/LOF/vaginal discharge. Denies urinary complaints.

## 2015-02-13 NOTE — Discharge Instructions (Signed)
Abdominal Pain During Pregnancy °Belly (abdominal) pain is common during pregnancy. Most of the time, it is not a serious problem. Other times, it can be a sign that something is wrong with the pregnancy. Always tell your doctor if you have belly pain. °HOME CARE °Monitor your belly pain for any changes. The following actions may help you feel better: °· Do not have sex (intercourse) or put anything in your vagina until you feel better. °· Rest until your pain stops. °· Drink clear fluids if you feel sick to your stomach (nauseous). Do not eat solid food until you feel better. °· Only take medicine as told by your doctor. °· Keep all doctor visits as told. °GET HELP RIGHT AWAY IF:  °· You are bleeding, leaking fluid, or pieces of tissue come out of your vagina. °· You have more pain or cramping. °· You keep throwing up (vomiting). °· You have pain when you pee (urinate) or have blood in your pee. °· You have a fever. °· You do not feel your baby moving as much. °· You feel very weak or feel like passing out. °· You have trouble breathing, with or without belly pain. °· You have a very bad headache and belly pain. °· You have fluid leaking from your vagina and belly pain. °· You keep having watery poop (diarrhea). °· Your belly pain does not go away after resting, or the pain gets worse. °MAKE SURE YOU:  °· Understand these instructions. °· Will watch your condition. °· Will get help right away if you are not doing well or get worse. °Document Released: 10/21/2009 Document Revised: 07/05/2013 Document Reviewed: 06/01/2013 °ExitCare® Patient Information ©2015 [employer]. This information is not intended to replace advice given to you by your health care provider. Make sure you discuss any questions you have with your health care provider. °Bacterial Vaginosis °Bacterial vaginosis is an infection of the vagina. It happens when too many of certain germs (bacteria) grow in the vagina. °HOME CARE °· Take your medicine  as told by your doctor. °· Finish your medicine even if you start to feel better. °· Do not have sex until you finish your medicine and are better. °· Tell your sex partner that you have an infection. They should see their doctor for treatment. °· Practice safe sex. Use condoms. Have only one sex partner. °GET HELP IF: °· You are not getting better after 3 days of treatment. °· You have more grey fluid (discharge) coming from your vagina than before. °· You have more pain than before. °· You have a fever. °MAKE SURE YOU:  °· Understand these instructions. °· Will watch your condition. °· Will get help right away if you are not doing well or get worse. °Document Released: 08/11/2008 Document Revised: 08/23/2013 Document Reviewed: 06/14/2013 °ExitCare® Patient Information ©2015 [employer]. This information is not intended to replace advice given to you by your health care provider. Make sure you discuss any questions you have with your health care provider. ° °

## 2015-02-14 LAB — RPR: RPR Ser Ql: NONREACTIVE

## 2015-02-14 LAB — GC/CHLAMYDIA PROBE AMP (~~LOC~~) NOT AT ARMC
Chlamydia: NEGATIVE
NEISSERIA GONORRHEA: NEGATIVE

## 2015-02-14 LAB — HIV ANTIBODY (ROUTINE TESTING W REFLEX): HIV Screen 4th Generation wRfx: NONREACTIVE

## 2015-02-26 LAB — OB RESULTS CONSOLE GC/CHLAMYDIA
Chlamydia: NEGATIVE
Gonorrhea: NEGATIVE

## 2015-02-26 LAB — OB RESULTS CONSOLE HIV ANTIBODY (ROUTINE TESTING): HIV: NONREACTIVE

## 2015-08-01 LAB — OB RESULTS CONSOLE GBS: STREP GROUP B AG: NEGATIVE

## 2015-09-03 ENCOUNTER — Inpatient Hospital Stay (HOSPITAL_COMMUNITY)
Admission: AD | Admit: 2015-09-03 | Discharge: 2015-09-05 | DRG: 775 | Disposition: A | Discharge status: Home / Self Care | Payer: Medicaid Other | Source: Ambulatory Visit | Attending: Obstetrics and Gynecology | Admitting: Obstetrics and Gynecology

## 2015-09-03 ENCOUNTER — Encounter (HOSPITAL_COMMUNITY): Payer: Self-pay | Admitting: *Deleted

## 2015-09-03 ENCOUNTER — Inpatient Hospital Stay (HOSPITAL_COMMUNITY): Payer: Medicaid Other | Admitting: Anesthesiology

## 2015-09-03 DIAGNOSIS — Z3480 Encounter for supervision of other normal pregnancy, unspecified trimester: Secondary | ICD-10-CM

## 2015-09-03 DIAGNOSIS — Z3A39 39 weeks gestation of pregnancy: Secondary | ICD-10-CM

## 2015-09-03 DIAGNOSIS — O4200 Premature rupture of membranes, onset of labor within 24 hours of rupture, unspecified weeks of gestation: Secondary | ICD-10-CM

## 2015-09-03 DIAGNOSIS — Z3483 Encounter for supervision of other normal pregnancy, third trimester: Secondary | ICD-10-CM

## 2015-09-03 LAB — CBC
HCT: 28 % — ABNORMAL LOW (ref 36.0–46.0)
HEMOGLOBIN: 9.4 g/dL — AB (ref 12.0–15.0)
MCH: 29.7 pg (ref 26.0–34.0)
MCHC: 33.6 g/dL (ref 30.0–36.0)
MCV: 88.6 fL (ref 78.0–100.0)
Platelets: 231 10*3/uL (ref 150–400)
RBC: 3.16 MIL/uL — AB (ref 3.87–5.11)
RDW: 13.9 % (ref 11.5–15.5)
WBC: 11.1 10*3/uL — ABNORMAL HIGH (ref 4.0–10.5)

## 2015-09-03 LAB — TYPE AND SCREEN
ABO/RH(D): O POS
ANTIBODY SCREEN: NEGATIVE

## 2015-09-03 LAB — POCT FERN TEST: POCT FERN TEST: NEGATIVE

## 2015-09-03 MED ORDER — LIDOCAINE HCL (PF) 1 % IJ SOLN
INTRAMUSCULAR | Status: DC | PRN
  Administered 2015-09-03 (×2): 4 mL

## 2015-09-03 MED ORDER — TETANUS-DIPHTH-ACELL PERTUSSIS 5-2.5-18.5 LF-MCG/0.5 IM SUSP: 0.5000 mL | Freq: Once | INTRAMUSCULAR | Status: DC

## 2015-09-03 MED ORDER — OXYTOCIN 40 UNITS IN LACTATED RINGERS INFUSION - SIMPLE MED: 62.5000 mL/h | INTRAVENOUS | Status: AC

## 2015-09-03 MED ORDER — LACTATED RINGERS IV SOLN
INTRAVENOUS | Status: DC
  Administered 2015-09-03: 14:00:00 via INTRAVENOUS

## 2015-09-03 MED ORDER — OXYCODONE-ACETAMINOPHEN 5-325 MG PO TABS
1.0000 | ORAL_TABLET | ORAL | Status: DC | PRN
  Administered 2015-09-04 – 2015-09-05 (×5): 1 via ORAL
  Filled 2015-09-03 (×6): qty 1

## 2015-09-03 MED ORDER — ZOLPIDEM TARTRATE 5 MG PO TABS: 5.0000 mg | ORAL_TABLET | Freq: Every evening | ORAL | Status: DC | PRN

## 2015-09-03 MED ORDER — ONDANSETRON HCL 4 MG PO TABS: 4.0000 mg | ORAL_TABLET | ORAL | Status: DC | PRN

## 2015-09-03 MED ORDER — LANOLIN HYDROUS EX OINT: TOPICAL_OINTMENT | CUTANEOUS | Status: DC | PRN

## 2015-09-03 MED ORDER — ONDANSETRON HCL 4 MG/2ML IJ SOLN: 4.0000 mg | INTRAMUSCULAR | Status: DC | PRN

## 2015-09-03 MED ORDER — ACETAMINOPHEN 325 MG PO TABS: 650.0000 mg | ORAL_TABLET | ORAL | Status: DC | PRN

## 2015-09-03 MED ORDER — OXYCODONE-ACETAMINOPHEN 5-325 MG PO TABS: 1.0000 | ORAL_TABLET | ORAL | Status: DC | PRN

## 2015-09-03 MED ORDER — SIMETHICONE 80 MG PO CHEW: 80.0000 mg | CHEWABLE_TABLET | ORAL | Status: DC | PRN

## 2015-09-03 MED ORDER — MEASLES, MUMPS & RUBELLA VAC ~~LOC~~ INJ: 0.5000 mL | INJECTION | Freq: Once | SUBCUTANEOUS | Status: DC

## 2015-09-03 MED ORDER — CITRIC ACID-SODIUM CITRATE 334-500 MG/5ML PO SOLN: 30.0000 mL | ORAL | Status: DC | PRN

## 2015-09-03 MED ORDER — BUTORPHANOL TARTRATE 1 MG/ML IJ SOLN
1.0000 mg | INTRAMUSCULAR | Status: DC | PRN
  Filled 2015-09-03: qty 1

## 2015-09-03 MED ORDER — EPHEDRINE 5 MG/ML INJ
10.0000 mg | INTRAVENOUS | Status: DC | PRN
  Filled 2015-09-03: qty 2

## 2015-09-03 MED ORDER — SENNOSIDES-DOCUSATE SODIUM 8.6-50 MG PO TABS
2.0000 | ORAL_TABLET | ORAL | Status: DC
  Administered 2015-09-04 – 2015-09-05 (×2): 2 via ORAL
  Filled 2015-09-03 (×2): qty 2

## 2015-09-03 MED ORDER — OXYCODONE-ACETAMINOPHEN 5-325 MG PO TABS: 2.0000 | ORAL_TABLET | ORAL | Status: DC | PRN

## 2015-09-03 MED ORDER — PHENYLEPHRINE 40 MCG/ML (10ML) SYRINGE FOR IV PUSH (FOR BLOOD PRESSURE SUPPORT)
80.0000 ug | PREFILLED_SYRINGE | INTRAVENOUS | Status: DC | PRN
Start: 2015-09-03 — End: 2015-09-03
  Filled 2015-09-03: qty 20
  Filled 2015-09-03: qty 2

## 2015-09-03 MED ORDER — FENTANYL 2.5 MCG/ML BUPIVACAINE 1/10 % EPIDURAL INFUSION (WH - ANES)
14.0000 mL/h | INTRAMUSCULAR | Status: DC | PRN
  Administered 2015-09-03 (×2): 14 mL/h via EPIDURAL
  Filled 2015-09-03: qty 125

## 2015-09-03 MED ORDER — WITCH HAZEL-GLYCERIN EX PADS: 1.0000 "application " | MEDICATED_PAD | CUTANEOUS | Status: DC | PRN

## 2015-09-03 MED ORDER — TERBUTALINE SULFATE 1 MG/ML IJ SOLN
0.2500 mg | Freq: Once | INTRAMUSCULAR | Status: DC | PRN
  Filled 2015-09-03: qty 1

## 2015-09-03 MED ORDER — DIBUCAINE 1 % RE OINT: 1.0000 "application " | TOPICAL_OINTMENT | RECTAL | Status: DC | PRN

## 2015-09-03 MED ORDER — LIDOCAINE HCL (PF) 1 % IJ SOLN
30.0000 mL | INTRAMUSCULAR | Status: DC | PRN
  Filled 2015-09-03: qty 30

## 2015-09-03 MED ORDER — ONDANSETRON HCL 4 MG/2ML IJ SOLN
4.0000 mg | Freq: Four times a day (QID) | INTRAMUSCULAR | Status: DC | PRN
  Administered 2015-09-03: 4 mg via INTRAVENOUS
  Filled 2015-09-03: qty 2

## 2015-09-03 MED ORDER — BENZOCAINE-MENTHOL 20-0.5 % EX AERO
1.0000 "application " | INHALATION_SPRAY | CUTANEOUS | Status: DC | PRN
  Administered 2015-09-04: 1 via TOPICAL
  Filled 2015-09-03: qty 56

## 2015-09-03 MED ORDER — OXYTOCIN 40 UNITS IN LACTATED RINGERS INFUSION - SIMPLE MED: 62.5000 mL/h | INTRAVENOUS | Status: DC

## 2015-09-03 MED ORDER — OXYTOCIN BOLUS FROM INFUSION: 500.0000 mL | INTRAVENOUS | Status: DC

## 2015-09-03 MED ORDER — PRENATAL MULTIVITAMIN CH
1.0000 | ORAL_TABLET | Freq: Every day | ORAL | Status: DC
  Administered 2015-09-04: 1 via ORAL
  Filled 2015-09-03: qty 1

## 2015-09-03 MED ORDER — DIPHENHYDRAMINE HCL 25 MG PO CAPS: 25.0000 mg | ORAL_CAPSULE | Freq: Four times a day (QID) | ORAL | Status: DC | PRN

## 2015-09-03 MED ORDER — LACTATED RINGERS IV SOLN
500.0000 mL | INTRAVENOUS | Status: DC | PRN
  Administered 2015-09-03 (×2): 500 mL via INTRAVENOUS

## 2015-09-03 MED ORDER — LACTATED RINGERS IV SOLN: INTRAVENOUS | Status: AC

## 2015-09-03 MED ORDER — OXYTOCIN 40 UNITS IN LACTATED RINGERS INFUSION - SIMPLE MED
1.0000 m[IU]/min | INTRAVENOUS | Status: DC
  Administered 2015-09-03: 2 m[IU]/min via INTRAVENOUS
  Filled 2015-09-03: qty 1000

## 2015-09-03 MED ORDER — DIPHENHYDRAMINE HCL 50 MG/ML IJ SOLN: 12.5000 mg | INTRAMUSCULAR | Status: DC | PRN

## 2015-09-03 MED ORDER — IBUPROFEN 600 MG PO TABS
600.0000 mg | ORAL_TABLET | Freq: Four times a day (QID) | ORAL | Status: DC
  Administered 2015-09-04 – 2015-09-05 (×5): 600 mg via ORAL
  Filled 2015-09-03 (×5): qty 1

## 2015-09-03 NOTE — MAU Note (Signed)
Contractions since around 0630 this a.m.  Pt has noted some blood with wiping, having clear discharge, unsure if SROM.  Membranes were stripped yesterday, SVE 2-3 cm's @ that time.

## 2015-09-03 NOTE — H&P (Signed)
Lori Pace is a 34 y.o. female, G2 P1001, EGA [redacted] weeks with EDC 10-19 presenting for ctx.  Eval in MAU over several hours, VE remained 3 cm, but she was too uncomfortable to go home.  She was admitted and put on pitocin augmentation, has received an epidural.  Prenatal care complicated by Hgb AS.  Maternal Medical History:  Reason for admission: Contractions.   Contractions: Frequency: irregular.   Perceived severity is moderate.    Fetal activity: Perceived fetal activity is normal.    Prenatal complications: no prenatal complications   OB History    Gravida Para Term Preterm AB TAB SAB Ectopic Multiple Living   2 1 1       1     SVD at term, no comps  Past Medical History  Diagnosis Date  . Headache(784.0)   . Anemia   . ROM (rupture of membranes), premature 07/14/2014   Past Surgical History  Procedure Laterality Date  . No past surgeries     Family History: family history includes Asthma in her sister; Cancer in her maternal aunt; Diabetes in her paternal grandmother. Social History:  reports that she has never smoked. She has never used smokeless tobacco. She reports that she does not drink alcohol or use illicit drugs.   Prenatal Transfer Tool  Maternal Diabetes: No Genetic Screening: Normal Maternal Ultrasounds/Referrals: Normal Fetal Ultrasounds or other Referrals:  None Maternal Substance Abuse:  No Significant Maternal Medications:  None Significant Maternal Lab Results:  Lab values include: Group B Strep negative Other Comments:  None  Review of Systems  Respiratory: Negative.   Cardiovascular: Negative.     Dilation: 5.5 Effacement (%): 80 Station: 0 Exam by:: Lyda Colcord, MD Blood pressure 124/78, pulse 79, temperature 98.6 F (37 C), temperature source Oral, resp. rate 18, height 5\' 1"  (1.549 m), weight 72.122 kg (159 lb), last menstrual period 10/16/2014, SpO2 100 %, not currently breastfeeding. Maternal Exam:  Uterine Assessment: Contraction  strength is moderate.  Contraction frequency is irregular.   Abdomen: Patient reports no abdominal tenderness. Estimated fetal weight is 7 lbs.   Fetal presentation: vertex  Introitus: Normal vulva. Normal vagina.  Pelvis: adequate for delivery.   Cervix: Cervix evaluated by digital exam.     Fetal Exam Fetal Monitor Review: Mode: ultrasound.   Baseline rate: 110.  Variability: moderate (6-25 bpm).   Pattern: accelerations present and no decelerations.    Fetal State Assessment: Category I - tracings are normal.     Physical Exam  Vitals reviewed. Constitutional: She appears well-developed and well-nourished.  Cardiovascular: Normal rate, regular rhythm and normal heart sounds.   No murmur heard. Respiratory: Effort normal and breath sounds normal. No respiratory distress. She has no wheezes.  GI: Soft.    Prenatal labs: ABO, Rh: --/--/O POS (10/18 1352) Antibody: NEG (10/18 1352) Rubella:  Immune RPR: Non Reactive (03/30 2200)  HBsAg:   Neg HIV: Non-reactive (04/12 0000)  GBS: Negative (09/15 0000)   Assessment/Plan: IUP at 39 weeks in early labor, on pitocin augmentation, membranes not palapted so SROM at some point.  Monitor progress, continue pitocin, anticipate SVD.   Lori Pace 09/03/2015, 4:50 PM

## 2015-09-03 NOTE — Anesthesia Preprocedure Evaluation (Signed)
Anesthesia Evaluation  Patient identified by MRN, date of birth, ID band Patient awake    Reviewed: Allergy & Precautions, NPO status , Patient's Chart, lab work & pertinent test results  History of Anesthesia Complications Negative for: history of anesthetic complications  Airway Mallampati: II  TM Distance: >3 FB Neck ROM: Full    Dental no notable dental hx. (+) Dental Advisory Given   Pulmonary neg pulmonary ROS,    Pulmonary exam normal breath sounds clear to auscultation       Cardiovascular negative cardio ROS Normal cardiovascular exam Rhythm:Regular Rate:Normal     Neuro/Psych  Headaches, negative psych ROS   GI/Hepatic negative GI ROS, Neg liver ROS,   Endo/Other  obesity  Renal/GU negative Renal ROS  negative genitourinary   Musculoskeletal negative musculoskeletal ROS (+)   Abdominal   Peds negative pediatric ROS (+)  Hematology negative hematology ROS (+)   Anesthesia Other Findings   Reproductive/Obstetrics (+) Pregnancy                             Anesthesia Physical Anesthesia Plan  ASA: II  Anesthesia Plan: Epidural   Post-op Pain Management:    Induction:   Airway Management Planned:   Additional Equipment:   Intra-op Plan:   Post-operative Plan:   Informed Consent: I have reviewed the patients History and Physical, chart, labs and discussed the procedure including the risks, benefits and alternatives for the proposed anesthesia with the patient or authorized representative who has indicated his/her understanding and acceptance.     Plan Discussed with: CRNA  Anesthesia Plan Comments:         Anesthesia Quick Evaluation

## 2015-09-03 NOTE — Anesthesia Procedure Notes (Signed)
Epidural Patient location during procedure: OB  Staffing Anesthesiologist: Aldric Wenzler Performed by: anesthesiologist   Preanesthetic Checklist Completed: patient identified, site marked, surgical consent, pre-op evaluation, timeout performed, IV checked, risks and benefits discussed and monitors and equipment checked  Epidural Patient position: sitting Prep: site prepped and draped and DuraPrep Patient monitoring: continuous pulse ox and blood pressure Approach: midline Location: L3-L4 Injection technique: LOR saline  Needle:  Needle type: Tuohy  Needle gauge: 17 G Needle length: 9 cm and 9 Needle insertion depth: 6 cm Catheter type: closed end flexible Catheter size: 19 Gauge Catheter at skin depth: 10 cm Test dose: negative  Assessment Events: blood not aspirated, injection not painful, no injection resistance, negative IV test and no paresthesia  Additional Notes Patient identified. Risks/Benefits/Options discussed with patient including but not limited to bleeding, infection, nerve damage, paralysis, failed block, incomplete pain control, headache, blood pressure changes, nausea, vomiting, reactions to medication both or allergic, itching and postpartum back pain. Confirmed with bedside nurse the patient's most recent platelet count. Confirmed with patient that they are not currently taking any anticoagulation, have any bleeding history or any family history of bleeding disorders. Patient expressed understanding and wished to proceed. All questions were answered. Sterile technique was used throughout the entire procedure. Please see nursing notes for vital signs. Test dose was given through epidural catheter and negative prior to continuing to dose epidural or start infusion. Warning signs of high block given to the patient including shortness of breath, tingling/numbness in hands, complete motor block, or any concerning symptoms with instructions to call for help. Patient was  given instructions on fall risk and not to get out of bed. All questions and concerns addressed with instructions to call with any issues or inadequate analgesia.    Patient was very tearful after the epidural. She said she just felt "overwhelmed". During the epidural I continuously asked her if I was hurting her and she said that she just felt pressure, nothing sharp from the epidural needle. I did not continue with the epidural until she was ready. She had very high anxiety during the procedure. After the epidural took effect, I went back to the room and she was not in any pain from her contractions and very thankful for the epidural.

## 2015-09-03 NOTE — Progress Notes (Signed)
Patient ID: Lori Pace, female   DOB: 03/24/1981, 34 y.o.   MRN: [id number] Delivery note:  Pt progressed to full dilatation and was asked to push. She was able to show 5-6 cm of caput with pushing but the FHR decelerated in spite of O2 AND PUSHING THE UTERUS TO THE LEFT so a mityvac bell cup vacuum was placed and the vertex was delivered with one contraction OA. There was shoulder dystocia managed by McRoberts, woodscrew, suprapubic pressure and LML episiotomy and entering the vagina with my hand,reaching the axilla and delivering the posterior arm.Shoulder dystocia lasted 1 minute and 15 seconds. Apgars were 8 and 9 at 1 and 5 minutes. The placenta delivered intact and the uterus was normal. The cervix prolapsed through the introitus. Rectal was negative . The LML episiotomy was sutured with 3-0 vicryl. EBL 300 cc's.

## 2015-09-04 LAB — CBC
HEMATOCRIT: 23.3 % — AB (ref 36.0–46.0)
Hemoglobin: 7.7 g/dL — ABNORMAL LOW (ref 12.0–15.0)
MCH: 29.4 pg (ref 26.0–34.0)
MCHC: 33 g/dL (ref 30.0–36.0)
MCV: 88.9 fL (ref 78.0–100.0)
Platelets: 213 10*3/uL (ref 150–400)
RBC: 2.62 MIL/uL — AB (ref 3.87–5.11)
RDW: 14.2 % (ref 11.5–15.5)
WBC: 17.5 10*3/uL — AB (ref 4.0–10.5)

## 2015-09-04 LAB — RPR: RPR: NONREACTIVE

## 2015-09-04 LAB — CCBB MATERNAL DONOR DRAW

## 2015-09-04 NOTE — Anesthesia Postprocedure Evaluation (Signed)
  Anesthesia Post-op Note  Patient: Lori Pace  Procedure(s) Performed: * No procedures listed *  Patient Location: Mother/Baby  Anesthesia Type:Epidural  Level of Consciousness: awake, alert  and oriented  Airway and Oxygen Therapy: Patient Spontanous Breathing  Post-op Pain: none  Post-op Assessment: Post-op Vital signs reviewed, Patient's Cardiovascular Status Stable, Respiratory Function Stable, Patent Airway, No signs of Nausea or vomiting, Adequate PO intake, Pain level controlled and No headache              Post-op Vital Signs: Reviewed and stable  Last Vitals:  Filed Vitals:   09/04/15 0650  BP: 96/55  Pulse: 79  Temp: 36.9 C  Resp: 18    Complications: No apparent anesthesia complications

## 2015-09-04 NOTE — Progress Notes (Signed)
UR chart review completed.  

## 2015-09-04 NOTE — Plan of Care (Signed)
Problem: Phase I Progression Outcomes Goal: Voiding adequately Outcome: Progressing No void, yet

## 2015-09-04 NOTE — Lactation Note (Addendum)
This note was copied from the chart of Lori Sammie Benchina Vargo. Lactation Consultation Note  Patient Name: Lori Pace EAVWU'JToday's Date: 09/04/2015 Reason for consult: Initial assessment;Other (Comment) (baby sleeping , not showing feeding cues, per mom he will have a bath soon , LC enc mom to call for feeding assess )  Baby is 18 hours old and has been to the breast 6X's from  8 - 25 mins, Latch scores 8-9 last fed at 1050 p for 10 mins per mom  , 1 stool, HNV , ( LC asked mom and mentioned  sometimes boys pee in the waist band of the diaper ) , per mom If that is the case we may have missed a wet diaper.  LC discussed the importance of having a feeding assessment by the each shift to see if the baby is getting consistent. Also that MBU RN's or LC can do a feeding assessment. Mother informed of post-discharge support and given phone number to the lactation department, including services for phone call assistance; out-patient appointments; and breastfeeding support group. List of other breastfeeding resources in the community given in the handout. Encouraged mother to call for problems or concerns related to breastfeeding.  Mom encouraged to page for feeding assessment.  @ [phone] - baby latched in football position with depth and multiply swallows noted , per mom comfortable. Latch score 9 with LC - baby still feeding at 11 mins , MBU RN aware to finish time documenting.     I   Maternal Data Does the patient have breastfeeding experience prior to this delivery?: Yes  Feeding Feeding Type:  (per mom baby last fed at 1050 for 10 mins )  LATCH Score/Interventions                Intervention(s): Breastfeeding basics reviewed     Lactation Tools Discussed/Used     Consult Status Consult Status: Follow-up Date: 09/04/15 (enc mom to call around bath time ) Follow-up type: In-patient    Lori Pace, Lori Pace 09/04/2015, 2:34 PM

## 2015-09-04 NOTE — Progress Notes (Signed)
Patient ID: Lori Pace, female   DOB: 07/04/1981, 34 y.o.   MRN: [id number] #1 afebrile this AM Temp 100.2 just after delivery. Pt has no complaints HGB 9.4 to 7.7

## 2015-09-05 MED ORDER — IBUPROFEN 600 MG PO TABS: 600.0000 mg | ORAL_TABLET | Freq: Four times a day (QID) | ORAL | Status: AC | PRN

## 2015-09-05 MED ORDER — FERROUS SULFATE 325 (65 FE) MG PO TABS: 325.0000 mg | ORAL_TABLET | Freq: Two times a day (BID) | ORAL | Status: DC

## 2015-09-05 MED ORDER — OXYCODONE-ACETAMINOPHEN 5-325 MG PO TABS: 1.0000 | ORAL_TABLET | Freq: Four times a day (QID) | ORAL | Status: DC | PRN

## 2015-09-05 MED ORDER — PRENATAL MULTIVITAMIN CH: 1.0000 | ORAL_TABLET | Freq: Every day | ORAL | Status: DC

## 2015-09-05 NOTE — Discharge Summary (Signed)
Lori Bench:  Pace, Lori Pace                 ACCOUNT NO.:  [id number]  MEDICAL RECORD NO.:  [id number]  LOCATION:  9116                          FACILITY:  [facility]  PHYSICIAN:  Malachi Prohomas F. Ambrose MantleHenley, M.D. DATE OF BIRTH:  May 12, 1981  DATE OF ADMISSION:  09/03/2015 DATE OF DISCHARGE:  09/05/2015                              DISCHARGE SUMMARY   HISTORY OF PRESENT ILLNESS:  A 34 year old black female, para 1-0-0-1, gravida 2, presented with contractions.  Vaginal exam remained 3 cm after evaluation in maternity admission unit as she was too uncomfortable to go home.  She was admitted to the hospital, progressed to full dilatation, and was asked to push.  She was able to show 5-6 cm of kaput with pushing, the fetal heart rate decelerated in spite of oxygen and pushing the uterus to the left, so a Mityvac Bell Cup Vacuum was placed and the vertex was delivered in one contraction OA.  There was shoulder dystocia managed by Cyd SilenceMcRoberts Woods Screw suprapubic pressure, and left mediolateral episiotomy in entering the vagina with my hand reaching the axilla and delivering the posterior horn.  Shoulder dystocia lasted 1 minute and 15 seconds.  Apgars were 8 and 9 at 1 and 5 minutes.  Placenta delivered intact.  Uterus normal.  The cervix prolapsed through the introitus.  Rectal was negative.  The left mediolateral episiotomy was sutured with 3-0 Vicryl.  Blood loss about 300 mL.  The patient developed a temperature elevation of 100.2 shortly after delivery but she remained afebrile thereafter. On the second postpartum day, she was afebrile.  Blood pressure was normal.  She had no complaints and was ready for discharge.  Initial hemoglobin was 9.4, hematocrit 28.0, white count 11,100, followup hemoglobin 7.7.  FINAL DIAGNOSIS:  Intrauterine pregnancy at 39 weeks, delivered OA.  OPERATION:  Vacuum-assisted vaginal delivery OA, left mediolateral episiotomy and repair.  CONDITION:  Improved.  INSTRUCTIONS:   Include our regular discharge instruction booklet, our after visit summary.  Prescriptions for ferrous sulfate 325 mg twice daily for 3 months, prenatal vitamins 1 a day, Percocet 5/325, 30 tablets, 1 every 6 hours as needed for pain, and Motrin 600 mg, 30 tablets, 1 every 6 hours as needed for pain.  She is advised to return to the office in 6 weeks for followup examination, to avoid intercourse. Prior to that if she desires, a method of birth control at the 6 weeks checkup.  She is also advised to schedule her baby's circumcision within 2 week's time.     Malachi Prohomas F. Ambrose MantleHenley, M.D.     TFH/MEDQ  D:  09/05/2015  T:  09/05/2015  Job:  [id number]

## 2015-09-05 NOTE — Progress Notes (Signed)
Patient ID: Lori Pace, female   DOB: 07/19/1981, 34 y.o.   MRN: [id number] #2 afebrile BP normal no complaints for d/c

## 2015-09-05 NOTE — Discharge Instructions (Signed)
booklet °

## 2015-09-10 ENCOUNTER — Emergency Department (HOSPITAL_COMMUNITY)
Admission: EM | Admit: 2015-09-10 | Discharge: 2015-09-10 | Disposition: A | Discharge status: Home / Self Care | Payer: Medicaid Other | Attending: Emergency Medicine | Admitting: Emergency Medicine

## 2015-09-10 ENCOUNTER — Encounter (HOSPITAL_COMMUNITY): Payer: Self-pay | Admitting: *Deleted

## 2015-09-10 DIAGNOSIS — IMO0001 Reserved for inherently not codable concepts without codable children: Secondary | ICD-10-CM

## 2015-09-10 DIAGNOSIS — H53149 Visual discomfort, unspecified: Secondary | ICD-10-CM | POA: Insufficient documentation

## 2015-09-10 DIAGNOSIS — O9989 Other specified diseases and conditions complicating pregnancy, childbirth and the puerperium: Secondary | ICD-10-CM | POA: Insufficient documentation

## 2015-09-10 DIAGNOSIS — R030 Elevated blood-pressure reading, without diagnosis of hypertension: Secondary | ICD-10-CM | POA: Insufficient documentation

## 2015-09-10 DIAGNOSIS — O99355 Diseases of the nervous system complicating the puerperium: Secondary | ICD-10-CM | POA: Diagnosis not present

## 2015-09-10 DIAGNOSIS — O9903 Anemia complicating the puerperium: Secondary | ICD-10-CM | POA: Diagnosis not present

## 2015-09-10 DIAGNOSIS — R51 Headache: Secondary | ICD-10-CM | POA: Diagnosis not present

## 2015-09-10 DIAGNOSIS — Z91040 Latex allergy status: Secondary | ICD-10-CM | POA: Diagnosis not present

## 2015-09-10 DIAGNOSIS — M549 Dorsalgia, unspecified: Secondary | ICD-10-CM | POA: Diagnosis not present

## 2015-09-10 DIAGNOSIS — R519 Headache, unspecified: Secondary | ICD-10-CM

## 2015-09-10 LAB — CBC WITH DIFFERENTIAL/PLATELET
BASOS ABS: 0 10*3/uL (ref 0.0–0.1)
Basophils Relative: 0 %
EOS ABS: 0.1 10*3/uL (ref 0.0–0.7)
EOS PCT: 1 %
HCT: 26.4 % — ABNORMAL LOW (ref 36.0–46.0)
Hemoglobin: 8.7 g/dL — ABNORMAL LOW (ref 12.0–15.0)
LYMPHS ABS: 1.7 10*3/uL (ref 0.7–4.0)
Lymphocytes Relative: 18 %
MCH: 29.6 pg (ref 26.0–34.0)
MCHC: 33 g/dL (ref 30.0–36.0)
MCV: 89.8 fL (ref 78.0–100.0)
Monocytes Absolute: 0.4 10*3/uL (ref 0.1–1.0)
Monocytes Relative: 5 %
Neutro Abs: 7.5 10*3/uL (ref 1.7–7.7)
Neutrophils Relative %: 76 %
PLATELETS: 362 10*3/uL (ref 150–400)
RBC: 2.94 MIL/uL — AB (ref 3.87–5.11)
RDW: 14.1 % (ref 11.5–15.5)
WBC: 9.8 10*3/uL (ref 4.0–10.5)

## 2015-09-10 LAB — URINALYSIS, ROUTINE W REFLEX MICROSCOPIC
Bilirubin Urine: NEGATIVE
GLUCOSE, UA: NEGATIVE mg/dL
Ketones, ur: NEGATIVE mg/dL
Nitrite: NEGATIVE
PH: 7.5 (ref 5.0–8.0)
Protein, ur: NEGATIVE mg/dL
SPECIFIC GRAVITY, URINE: 1.009 (ref 1.005–1.030)
Urobilinogen, UA: 1 mg/dL (ref 0.0–1.0)

## 2015-09-10 LAB — COMPREHENSIVE METABOLIC PANEL
ALT: 28 U/L (ref 14–54)
AST: 25 U/L (ref 15–41)
Albumin: 2.8 g/dL — ABNORMAL LOW (ref 3.5–5.0)
Alkaline Phosphatase: 109 U/L (ref 38–126)
Anion gap: 8 (ref 5–15)
BUN: 7 mg/dL (ref 6–20)
CHLORIDE: 103 mmol/L (ref 101–111)
CO2: 26 mmol/L (ref 22–32)
CREATININE: 0.67 mg/dL (ref 0.44–1.00)
Calcium: 8.8 mg/dL — ABNORMAL LOW (ref 8.9–10.3)
GFR calc non Af Amer: >60 mL/min (ref >60)
Glucose, Bld: 86 mg/dL (ref 65–99)
POTASSIUM: 3.3 mmol/L — AB (ref 3.5–5.1)
SODIUM: 137 mmol/L (ref 135–145)
Total Bilirubin: 0.9 mg/dL (ref 0.3–1.2)
Total Protein: 6.7 g/dL (ref 6.5–8.1)

## 2015-09-10 LAB — URINE MICROSCOPIC-ADD ON

## 2015-09-10 MED ORDER — HYDRALAZINE HCL 20 MG/ML IJ SOLN
5.0000 mg | Freq: Once | INTRAMUSCULAR | Status: AC
Start: 2015-09-10 — End: 2015-09-10
  Administered 2015-09-10: 5 mg via INTRAVENOUS
  Filled 2015-09-10: qty 1

## 2015-09-10 MED ORDER — HYDRALAZINE HCL 20 MG/ML IJ SOLN
10.0000 mg | Freq: Once | INTRAMUSCULAR | Status: AC
Start: 2015-09-10 — End: 2015-09-10
  Administered 2015-09-10: 10 mg via INTRAVENOUS

## 2015-09-10 MED ORDER — METOCLOPRAMIDE HCL 5 MG/ML IJ SOLN
10.0000 mg | Freq: Once | INTRAMUSCULAR | Status: AC
  Administered 2015-09-10: 10 mg via INTRAVENOUS
  Filled 2015-09-10: qty 2

## 2015-09-10 MED ORDER — ACETAMINOPHEN 325 MG PO TABS
650.0000 mg | ORAL_TABLET | ORAL | Status: DC | PRN
  Administered 2015-09-10: 650 mg via ORAL
  Filled 2015-09-10: qty 2

## 2015-09-10 MED ORDER — KETOROLAC TROMETHAMINE 30 MG/ML IJ SOLN
30.0000 mg | Freq: Once | INTRAMUSCULAR | Status: AC
  Administered 2015-09-10: 30 mg via INTRAVENOUS
  Filled 2015-09-10: qty 1

## 2015-09-10 MED ORDER — NIFEDIPINE ER OSMOTIC RELEASE 30 MG PO TB24: 30.0000 mg | ORAL_TABLET | Freq: Every day | ORAL | Status: DC

## 2015-09-10 NOTE — ED Provider Notes (Signed)
CSN: [id number]     Arrival date & time 09/10/15  0016 History  By signing my name below, I, Freida Busmaniana Omoyeni, attest that this documentation has been prepared under the direction and in the presence of Loren Raceravid Adalae Baysinger, MD . Electronically Signed: Freida Busmaniana Omoyeni, Scribe. 09/10/2015. 6:50 AM.  Chief Complaint  Patient presents with  . Back Pain  . Headache   The history is provided by the patient. No language interpreter was used.     HPI Comments:  Lori Pace is a 34 y.o. female 6 days postpartum, who presents to the Emergency Department complaining of a constant, throbbing, bilateral HA since waking yesterday AM. She denies h/o similar HA. Pt notes her pain is better when she stands up and worse when she lies down. She reports associated photophobia and phonophobia. She also complains of  intermittent back pain for 3 days; states pain "shoots up back to neck". Pt had a motrin ~ 130 pm with minimal relief. She denies nausea, vomiting, fever, nasal congestion, rhinorrhea, sore throat. Pt delivered  full-term, vaginally and with an epidural; denies complications during pregnancy. She is a patient of [facility] per orthopedics, Dr. Ellyn HackBovard.   Past Medical History  Diagnosis Date  . Headache(784.0)   . Anemia   . ROM (rupture of membranes), premature 07/14/2014   Past Surgical History  Procedure Laterality Date  . No past surgeries     Family History  Problem Relation Age of Onset  . Asthma Sister   . Cancer Maternal Aunt   . Diabetes Paternal Grandmother    Social History  Substance Use Topics  . Smoking status: Never Smoker   . Smokeless tobacco: Never Used  . Alcohol Use: No   OB History    Gravida Para Term Preterm AB TAB SAB Ectopic Multiple Living   2 2 2       0 2     Review of Systems  Constitutional: Negative for fever and chills.  HENT: Negative for congestion, rhinorrhea and sore throat.   Eyes: Positive for photophobia.  Respiratory: Negative for shortness of breath.    Cardiovascular: Negative for chest pain.  Gastrointestinal: Negative for nausea, vomiting and abdominal pain.  Musculoskeletal: Positive for back pain. Negative for neck pain and neck stiffness.  Skin: Negative for rash and wound.  Neurological: Positive for headaches. Negative for dizziness, seizures, syncope, weakness and numbness.  All other systems reviewed and are negative.  Allergies  Latex  Home Medications   Prior to Admission medications   Medication Sig Start Date End Date Taking? Authorizing Provider  ferrous sulfate 325 (65 FE) MG tablet Take 1 tablet (325 mg total) by mouth 2 (two) times daily with a meal. 09/05/15   Tracey Harrieshomas Henley, MD  ibuprofen (ADVIL,MOTRIN) 600 MG tablet Take 1 tablet (600 mg total) by mouth every 6 (six) hours as needed. 09/05/15   Tracey Harrieshomas Henley, MD  NIFEdipine (PROCARDIA XL) 30 MG 24 hr tablet Take 1 tablet (30 mg total) by mouth daily. 09/10/15   Loren Raceravid Lovie Agresta, MD  oxyCODONE-acetaminophen (PERCOCET/ROXICET) 5-325 MG tablet Take 1 tablet by mouth every 6 (six) hours as needed (for pain scale 4-7). 09/05/15   Tracey Harrieshomas Henley, MD  Prenatal Vit-Fe Fumarate-FA (PRENATAL MULTIVITAMIN) TABS tablet Take 1 tablet by mouth daily at 12 noon. 09/05/15   Tracey Harrieshomas Henley, MD   BP 136/75 mmHg  Pulse 76  Temp(Src) 98.1 F (36.7 C) (Oral)  Resp 17  SpO2 99%  LMP 10/16/2014 (Within Months)  Breastfeeding? Yes Physical  Exam  Constitutional: She is oriented to person, place, and time. She appears well-developed and well-nourished. No distress.  HENT:  Head: Normocephalic and atraumatic.  Mouth/Throat: Oropharynx is clear and moist. No oropharyngeal exudate.  Eyes: EOM are normal. Pupils are equal, round, and reactive to light.  Neck: Normal range of motion. Neck supple.  No posterior midline cervical tenderness to palpation. No meningismus.  Cardiovascular: Normal rate and regular rhythm.   Pulmonary/Chest: Effort normal and breath sounds normal. No respiratory  distress. She has no wheezes. She has no rales. She exhibits no tenderness.  Abdominal: Soft. Bowel sounds are normal. She exhibits no distension and no mass. There is no tenderness. There is no rebound and no guarding.  Musculoskeletal: Normal range of motion. She exhibits no edema or tenderness.  No lower extremity swelling or pain.  Neurological: She is alert and oriented to person, place, and time.  Patient is alert and oriented x3 with clear, goal oriented speech. Patient has 5/5 motor in all extremities. Sensation is intact to light touch. Bilateral finger-to-nose is normal with no signs of dysmetria. Patient has a normal gait and walks without assistance.  Skin: Skin is warm and dry. No rash noted. No erythema.  Psychiatric: She has a normal mood and affect. Her behavior is normal.  Nursing note and vitals reviewed.   ED Course  Procedures   DIAGNOSTIC STUDIES:  Oxygen Saturation is 100% on RA, normal by my interpretation.    COORDINATION OF CARE:  3:37 AM Discussed treatment plan with pt at bedside and pt agreed to plan.  Labs Review Labs Reviewed  CBC WITH DIFFERENTIAL/PLATELET - Abnormal; Notable for the following:    RBC 2.94 (*)    Hemoglobin 8.7 (*)    HCT 26.4 (*)    All other components within normal limits  COMPREHENSIVE METABOLIC PANEL - Abnormal; Notable for the following:    Potassium 3.3 (*)    Calcium 8.8 (*)    Albumin 2.8 (*)    All other components within normal limits  URINALYSIS, ROUTINE W REFLEX MICROSCOPIC (NOT AT [facility]) - Abnormal; Notable for the following:    Hgb urine dipstick LARGE (*)    Leukocytes, UA MODERATE (*)    All other components within normal limits  URINE MICROSCOPIC-ADD ON    Imaging Review No results found. I have personally reviewed and evaluated these images and lab results as part of my medical decision-making.   EKG Interpretation None      MDM   Final diagnoses:  Elevated blood pressure  Acute nonintractable  headache, unspecified headache type    I personally performed the services described in this documentation, which was scribed in my presence. The recorded information has been reviewed and is accurate.   Patient is well-appearing. Normal neurologic exam. Labs are within normal limits. Patient's blood pressure has been persistently elevated since arrival in the emergency department. Improved briefly with hydralazine 5 mg. Was also treated with Tylenol. Patient states her headache has not improved though she is resting comfortably. Discussed with Dr. Jackelyn Knife who is on call for Dr. Ellyn Hack. Recommended starting the patient on Procardia XL 30 mg daily and having her call the office today to arrange follow-up. She understands the need to return immediately for worsening headache, seizures, abdominal pain, persistent vomiting, fever or for any concerns.  Patient's headache is improved. Blood pressures consistently under systolic of 140. We'll discharge home and return precautions given.  Loren Racer, MD 09/10/15 [phone]

## 2015-09-10 NOTE — Discharge Instructions (Signed)

## 2015-09-10 NOTE — ED Notes (Addendum)
Pt reports intermittent back pain, starts at her neck and radiates down her spine, pt admits to recently having an epidural d/t child birth - pt also admits to progressively worse HA. Pt d/c'd home w/ rx for ibuprofen and percocet - pt admits to no relief w/ these medications.

## 2016-02-08 ENCOUNTER — Emergency Department (HOSPITAL_COMMUNITY)
Admission: EM | Admit: 2016-02-08 | Discharge: 2016-02-08 | Disposition: A | Discharge status: Home / Self Care | Payer: Medicaid Other | Source: Home / Self Care | Attending: Family Medicine | Admitting: Family Medicine

## 2016-02-08 ENCOUNTER — Encounter (HOSPITAL_COMMUNITY): Payer: Self-pay | Admitting: *Deleted

## 2016-02-08 DIAGNOSIS — R69 Illness, unspecified: Principal | ICD-10-CM

## 2016-02-08 DIAGNOSIS — J111 Influenza due to unidentified influenza virus with other respiratory manifestations: Secondary | ICD-10-CM

## 2016-02-08 MED ORDER — OSELTAMIVIR PHOSPHATE 75 MG PO CAPS: 75.0000 mg | ORAL_CAPSULE | Freq: Two times a day (BID) | ORAL | Status: DC

## 2016-02-08 MED ORDER — ONDANSETRON 4 MG PO TBDP
ORAL_TABLET | ORAL | Status: AC
  Filled 2016-02-08: qty 1

## 2016-02-08 MED ORDER — ONDANSETRON 4 MG PO TBDP
4.0000 mg | ORAL_TABLET | Freq: Once | ORAL | Status: AC
  Administered 2016-02-08: 4 mg via ORAL

## 2016-02-08 MED ORDER — ONDANSETRON HCL 4 MG PO TABS: 4.0000 mg | ORAL_TABLET | Freq: Four times a day (QID) | ORAL | Status: DC

## 2016-02-08 NOTE — ED Notes (Signed)
Pt  Reports       Of   Nausea  Body  Aches       And           Nausea   With  Vomiting           Symptoms     Not  releived  By  otc  meds

## 2016-02-08 NOTE — ED Provider Notes (Signed)
CSN: [id number]     Arrival date & time 02/08/16  1339 History   First MD Initiated Contact with Patient 02/08/16 1508     Chief Complaint  Patient presents with  . Influenza   (Consider location/radiation/quality/duration/timing/severity/associated sxs/prior Treatment) Patient is a 35 y.o. female presenting with flu symptoms. The history is provided by the patient.  Influenza Presenting symptoms: fatigue, fever, myalgias and nausea   Presenting symptoms: no rhinorrhea, no shortness of breath and no vomiting   Severity:  Moderate Onset quality:  Sudden Duration:  20 hours Progression:  Unchanged Chronicity:  New Relieved by:  None tried Associated symptoms: chills, decreased appetite and decreased physical activity     Past Medical History  Diagnosis Date  . Headache(784.0)   . Anemia   . ROM (rupture of membranes), premature 07/14/2014   Past Surgical History  Procedure Laterality Date  . No past surgeries     Family History  Problem Relation Age of Onset  . Asthma Sister   . Cancer Maternal Aunt   . Diabetes Paternal Grandmother    Social History  Substance Use Topics  . Smoking status: Never Smoker   . Smokeless tobacco: Never Used  . Alcohol Use: No   OB History    Gravida Para Term Preterm AB TAB SAB Ectopic Multiple Living   0 2     Review of Systems  Constitutional: Positive for fever, chills, activity change, appetite change, fatigue and decreased appetite.  HENT: Negative for postnasal drip and rhinorrhea.   Respiratory: Negative for shortness of breath and wheezing.   Gastrointestinal: Positive for nausea. Negative for vomiting.  Genitourinary: Negative.   Musculoskeletal: Positive for myalgias.  Skin: Negative.   All other systems reviewed and are negative.   Allergies  Latex  Home Medications   Prior to Admission medications   Medication Sig Start Date End Date Taking? Authorizing Provider  ferrous sulfate 325 (65 FE) MG tablet  Take 1 tablet (325 mg total) by mouth 2 (two) times daily with a meal. 09/05/15   Tracey Harries, MD  ibuprofen (ADVIL,MOTRIN) 600 MG tablet Take 1 tablet (600 mg total) by mouth every 6 (six) hours as needed. 09/05/15   Tracey Harries, MD  NIFEdipine (PROCARDIA XL) 30 MG 24 hr tablet Take 1 tablet (30 mg total) by mouth daily. 09/10/15   Loren Racer, MD  oseltamivir (TAMIFLU) 75 MG capsule Take 1 capsule (75 mg total) by mouth every 12 (twelve) hours. Take all of medication. 02/08/16   Linna Hoff, MD  oxyCODONE-acetaminophen (PERCOCET/ROXICET) 5-325 MG tablet Take 1 tablet by mouth every 6 (six) hours as needed (for pain scale 4-7). 09/05/15   Tracey Harries, MD  Prenatal Vit-Fe Fumarate-FA (PRENATAL MULTIVITAMIN) TABS tablet Take 1 tablet by mouth daily at 12 noon. 09/05/15   Tracey Harries, MD   Meds Ordered and Administered this Visit   Medications  ondansetron (ZOFRAN-ODT) disintegrating tablet 4 mg (not administered)    BP 97/72 mmHg  Pulse 99  Temp(Src) 99.7 F (37.6 C) (Oral)  SpO2 100% No data found.   Physical Exam  Constitutional: She is oriented to person, place, and time. She appears well-developed and well-nourished. No distress.  HENT:  Head: Normocephalic.  Right Ear: External ear normal.  Left Ear: External ear normal.  Mouth/Throat: Oropharynx is clear and moist.  Neck: Normal range of motion. Neck supple.  Cardiovascular: Normal rate, normal heart sounds and intact distal pulses.  Pulmonary/Chest: Effort normal and breath sounds normal.  Abdominal: Soft. Bowel sounds are normal.  Lymphadenopathy:    She has no cervical adenopathy.  Neurological: She is alert and oriented to person, place, and time.  Skin: Skin is warm and dry.  Nursing note and vitals reviewed.   ED Course  Procedures (including critical care time)  Labs Review Labs Reviewed - No data to display  Imaging Review No results found.   Visual Acuity Review  Right Eye Distance:    Left Eye Distance:   Bilateral Distance:    Right Eye Near:   Left Eye Near:    Bilateral Near:         MDM   1. Influenza-like illness        Linna HoffJames D Phillp Dolores, MD 02/08/16 [phone]

## 2016-04-12 IMAGING — US US OB COMP LESS 14 WK
1 series · 14 of 28 positions shown · non-contrast
Comparison: None.

CLINICAL DATA: Vaginal bleeding.  Pregnancy.

EXAM:
OBSTETRIC <14 WK US AND TRANSVAGINAL OB US
TECHNIQUE: Both transabdominal and transvaginal ultrasound examinations were
performed for complete evaluation of the gestation as well as the
maternal uterus, adnexal regions, and pelvic cul-de-sac.
Transvaginal technique was performed to assess early pregnancy.

[Series 1: us ob comp less 14 wk · 0.21mm/px · 14 of 66 slices shown]
[im 3/66]
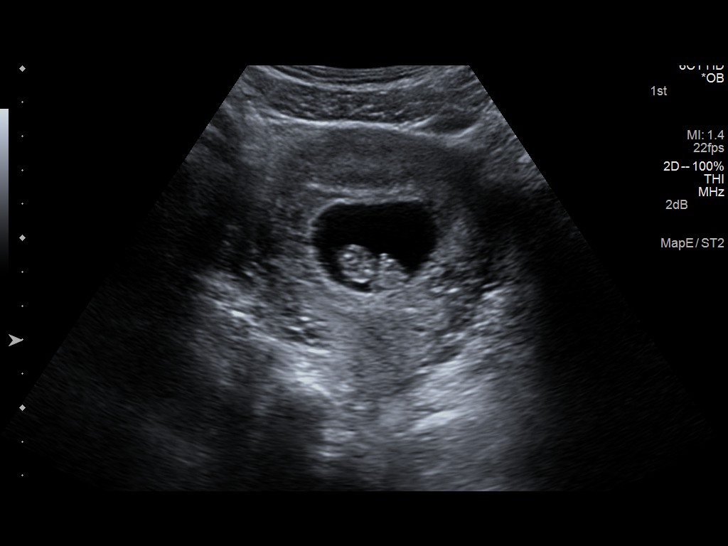
[im 8/66]
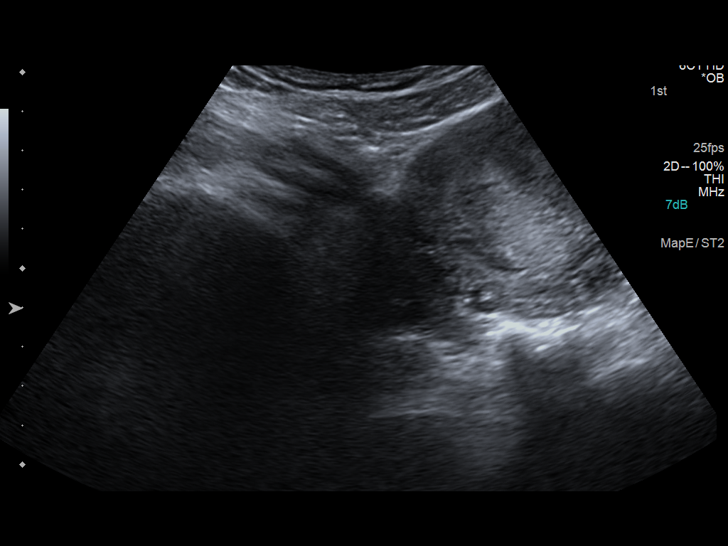
[im 13/66]
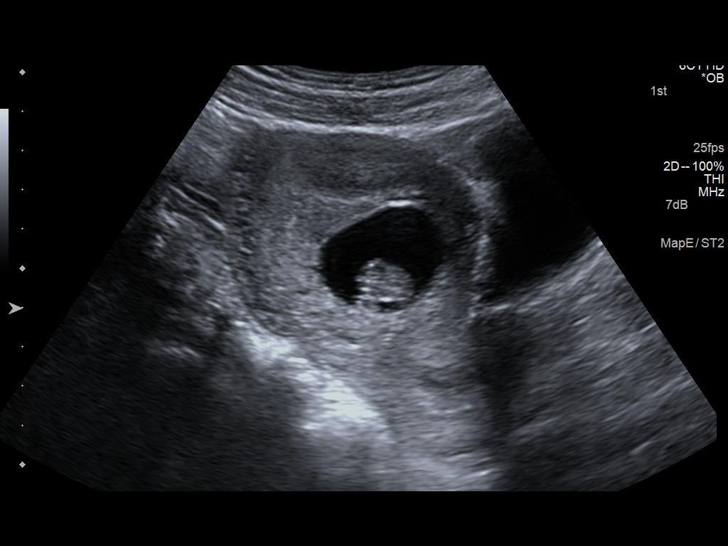
[im 17/66]
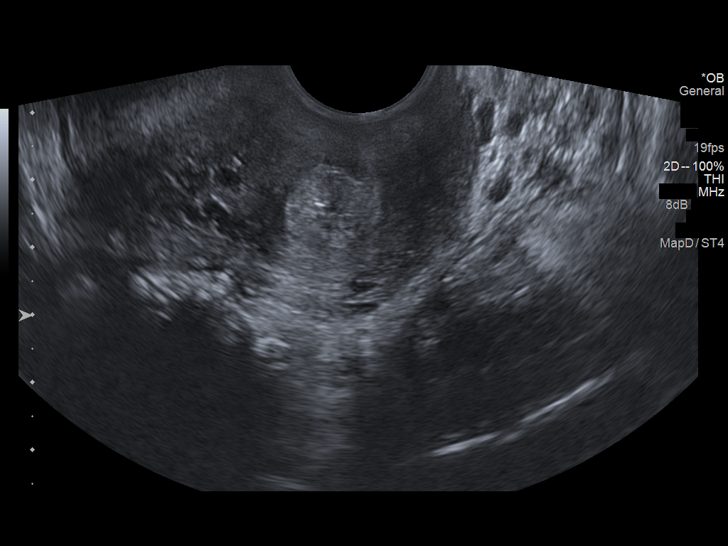
[im 22/66]
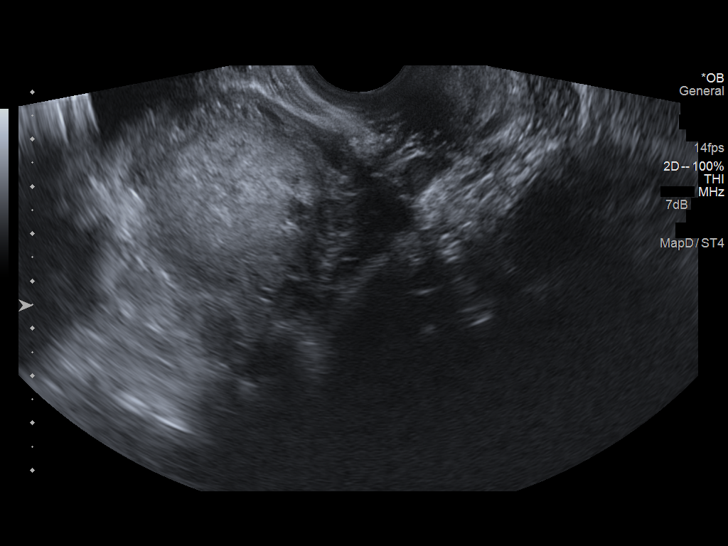
[im 27/66]
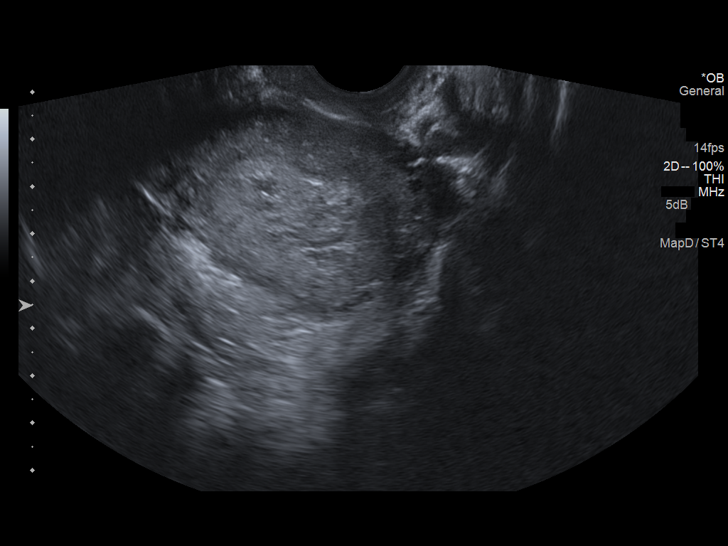
[im 32/66]
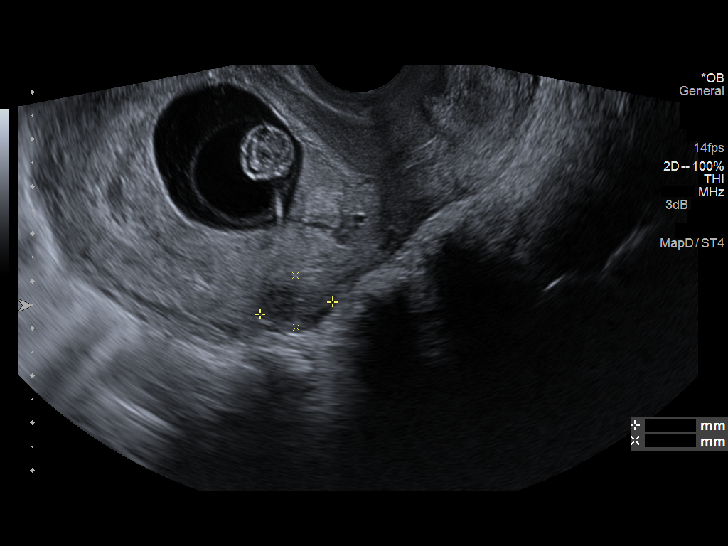
[im 37/66]
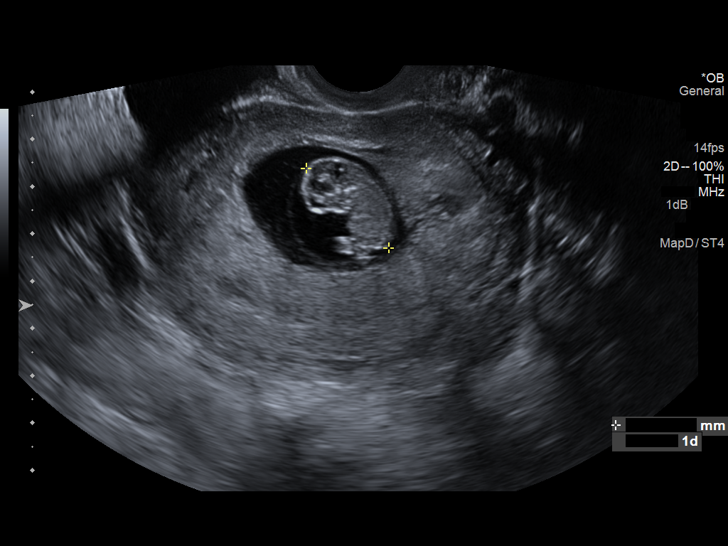
[im 41/66]
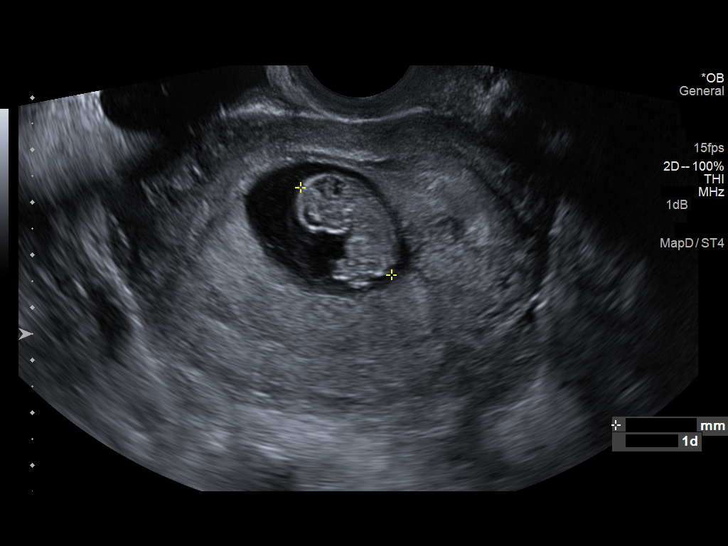
[im 46/66]
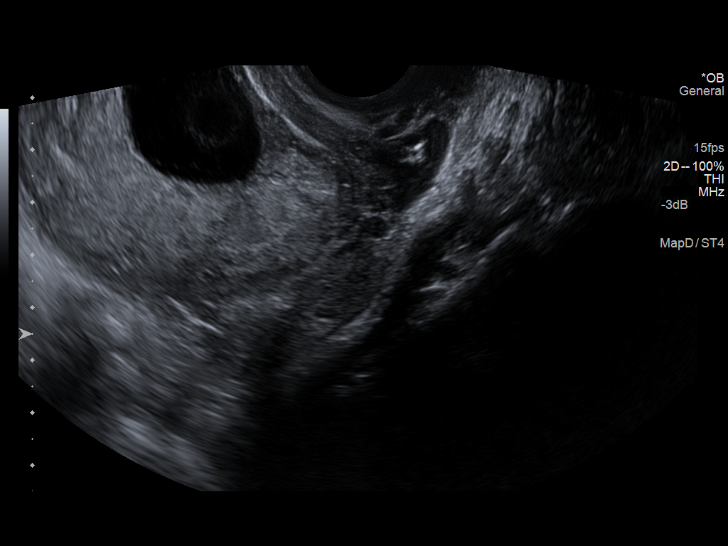
[im 51/66]
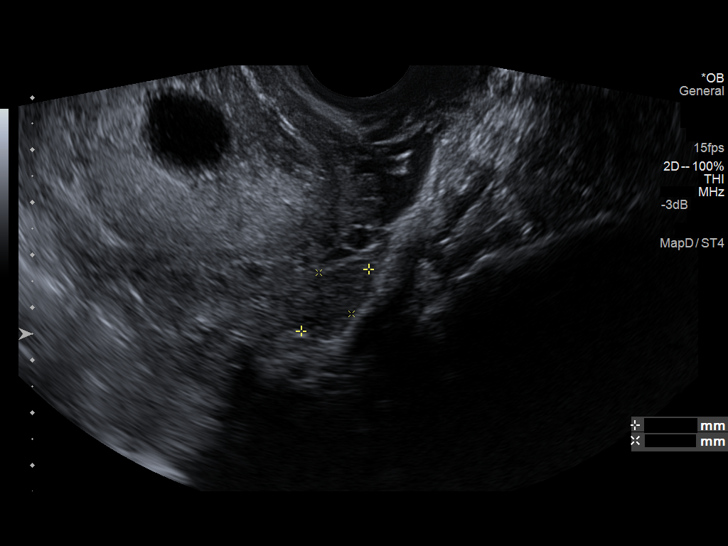
[im 56/66]
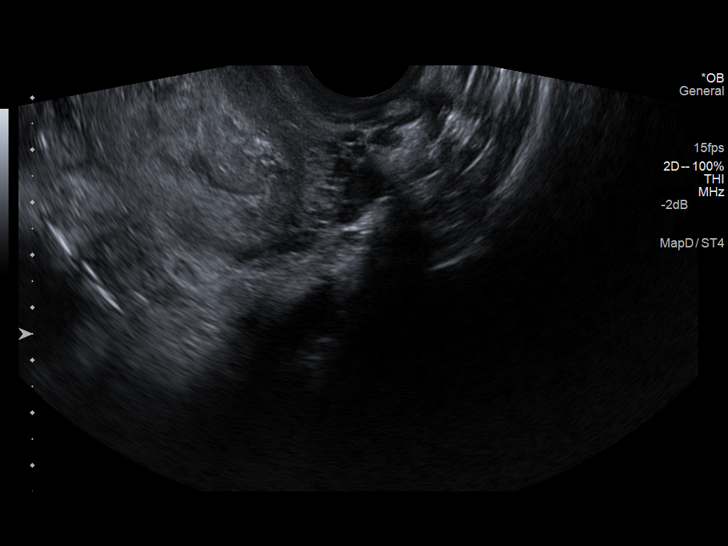
[im 61/66]
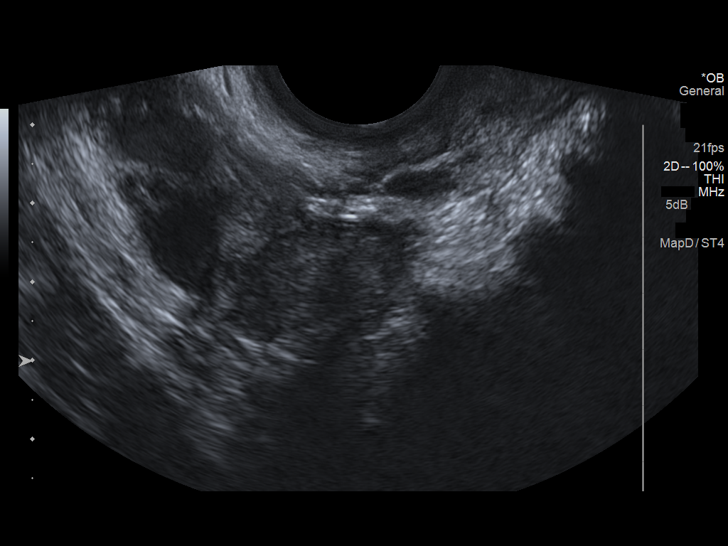
[im 66/66]
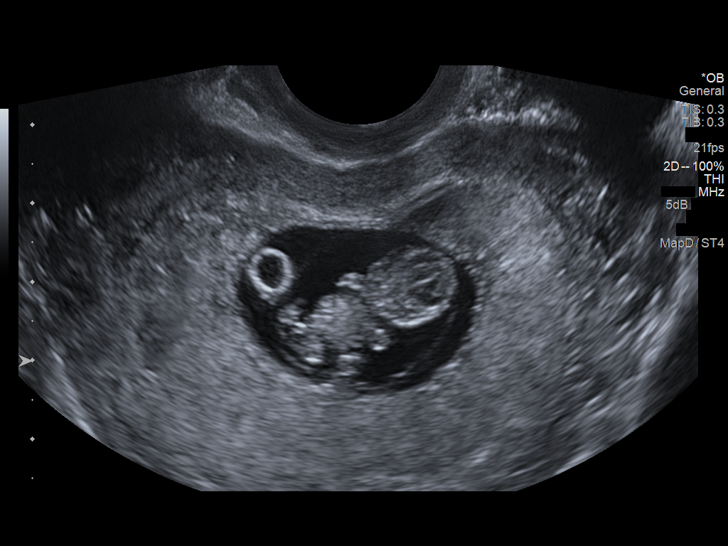

[14 of 28 positions shown; findings below may reference images not displayed]

FINDINGS: Intrauterine gestational sac: Visualized/normal in shape.

Yolk sac:  Present

Embryo:  Present

Cardiac Activity: Present

Heart Rate: 169  bpm

MSD:   mm    w     d

CRL:  24.1  mm   9 w   1 d                  US EDC: 09/04/2015

Maternal uterus/adnexae: Negative for subchorionic hemorrhage.
Ovaries are normal.

16 mm uterine fibroid posteriorly
IMPRESSION: Normal intrauterine living pregnancy 9 weeks 1 day

## 2017-01-01 ENCOUNTER — Ambulatory Visit (HOSPITAL_COMMUNITY)
Admission: EM | Admit: 2017-01-01 | Discharge: 2017-01-01 | Disposition: A | Discharge status: Home / Self Care | Payer: Medicaid Other | Attending: Family Medicine | Admitting: Family Medicine

## 2017-01-01 ENCOUNTER — Encounter (HOSPITAL_COMMUNITY): Payer: Self-pay

## 2017-01-01 DIAGNOSIS — J069 Acute upper respiratory infection, unspecified: Secondary | ICD-10-CM

## 2017-01-01 DIAGNOSIS — I889 Nonspecific lymphadenitis, unspecified: Secondary | ICD-10-CM

## 2017-01-01 MED ORDER — DOXYCYCLINE HYCLATE 100 MG PO CAPS: 100.0000 mg | ORAL_CAPSULE | Freq: Two times a day (BID) | ORAL | 0 refills | Status: DC

## 2017-01-01 MED ORDER — IPRATROPIUM BROMIDE 0.06 % NA SOLN: 2.0000 | Freq: Four times a day (QID) | NASAL | 1 refills | Status: AC

## 2017-01-01 NOTE — Discharge Instructions (Signed)
Warm cloth to left arm twice a day with antibiotic, use nose spray and lots of fluids, return if further problems.

## 2017-01-01 NOTE — ED Provider Notes (Signed)
[facility]    CSN: [id number] Arrival date & time: 01/01/17  1234     History   Chief Complaint Chief Complaint  Patient presents with  . Nasal Congestion    HPI Lori Pace is a 36 y.o. female.   The history is provided by the patient.  URI  Presenting symptoms: congestion, rhinorrhea and sore throat   Presenting symptoms: no fever   Severity:  Mild Onset quality:  Gradual Duration:  1 week Progression:  Worsening Chronicity:  New Relieved by:  None tried Ineffective treatments:  None tried Associated symptoms comment:  Left axillary pain and swelling.   Past Medical History:  Diagnosis Date  . Anemia   . Headache(784.0)   . ROM (rupture of membranes), premature 07/14/2014    Patient Active Problem List   Diagnosis Date Noted  . Normal pregnancy, repeat 09/03/2015  . SVD (spontaneous vaginal delivery) 09/03/2015  . Vacuum extraction, delivered, current hospitalization 07/16/2014  . ROM (rupture of membranes), premature 07/14/2014    Past Surgical History:  Procedure Laterality Date  . NO PAST SURGERIES      OB History    Gravida Para Term Preterm AB Living   2 2 2     2    SAB TAB Ectopic Multiple Live Births         0 2       Home Medications    Prior to Admission medications   Medication Sig Start Date End Date Taking? Authorizing Provider  doxycycline (VIBRAMYCIN) 100 MG capsule Take 1 capsule (100 mg total) by mouth 2 (two) times daily. 01/01/17   Linna HoffJames D Willadene Mounsey, MD  ferrous sulfate 325 (65 FE) MG tablet Take 1 tablet (325 mg total) by mouth 2 (two) times daily with a meal. 09/05/15   Tracey Harrieshomas Henley, MD  ibuprofen (ADVIL,MOTRIN) 600 MG tablet Take 1 tablet (600 mg total) by mouth every 6 (six) hours as needed. 09/05/15   Tracey Harrieshomas Henley, MD  ipratropium (ATROVENT) 0.06 % nasal spray Place 2 sprays into both nostrils 4 (four) times daily. 01/01/17   Linna HoffJames D Guilford Shannahan, MD  NIFEdipine (PROCARDIA XL) 30 MG 24 hr tablet Take 1 tablet (30 mg  total) by mouth daily. 09/10/15   Loren Raceravid Yelverton, MD  ondansetron (ZOFRAN) 4 MG tablet Take 1 tablet (4 mg total) by mouth every 6 (six) hours. For n/v 02/08/16   Linna HoffJames D Marvin Maenza, MD  oseltamivir (TAMIFLU) 75 MG capsule Take 1 capsule (75 mg total) by mouth every 12 (twelve) hours. Take all of medication. 02/08/16   Linna HoffJames D Lamica Mccart, MD  oxyCODONE-acetaminophen (PERCOCET/ROXICET) 5-325 MG tablet Take 1 tablet by mouth every 6 (six) hours as needed (for pain scale 4-7). 09/05/15   Tracey Harrieshomas Henley, MD  Prenatal Vit-Fe Fumarate-FA (PRENATAL MULTIVITAMIN) TABS tablet Take 1 tablet by mouth daily at 12 noon. 09/05/15   Tracey Harrieshomas Henley, MD    Family History Family History  Problem Relation Age of Onset  . Asthma Sister   . Cancer Maternal Aunt   . Diabetes Paternal Grandmother     Social History Social History  Substance Use Topics  . Smoking status: Never Smoker  . Smokeless tobacco: Never Used  . Alcohol use No     Allergies   Latex   Review of Systems Review of Systems  Constitutional: Negative for chills and fever.  HENT: Positive for congestion, rhinorrhea and sore throat.   Respiratory: Negative.   Cardiovascular: Negative.   Gastrointestinal: Negative.   Genitourinary: Negative.  Hematological: Positive for adenopathy.     Physical Exam Triage Vital Signs ED Triage Vitals [01/01/17 1350]  Enc Vitals Group     BP (!) 87/55     Pulse Rate 68     Resp 20     Temp 98.9 F (37.2 C)     Temp Source Oral     SpO2 100 %     Weight      Height      Head Circumference      Peak Flow      Pain Score 5     Pain Loc      Pain Edu?      Excl. in GC?    No data found.   Updated Vital Signs BP (!) 87/55 (BP Location: Left Arm)   Pulse 68   Temp 98.9 F (37.2 C) (Oral)   Resp 20   LMP  (LMP Unknown)   SpO2 100%   Breastfeeding? No   Visual Acuity Right Eye Distance:   Left Eye Distance:   Bilateral Distance:    Right Eye Near:   Left Eye Near:    Bilateral  Near:     Physical Exam  Constitutional: She is oriented to person, place, and time. She appears well-developed and well-nourished. No distress.  HENT:  Right Ear: External ear normal.  Left Ear: External ear normal.  Nose: Nose normal.  Mouth/Throat: Oropharynx is clear and moist.  Eyes: Pupils are equal, round, and reactive to light.  Neck: Normal range of motion. Neck supple.  Cardiovascular: Normal rate and regular rhythm.   Pulmonary/Chest: Effort normal and breath sounds normal.  Musculoskeletal: She exhibits tenderness.  Lymphadenopathy:    She has no cervical adenopathy.  Neurological: She is alert and oriented to person, place, and time.  Skin: Skin is warm and dry.  Nursing note and vitals reviewed.    UC Treatments / Results  Labs (all labs ordered are listed, but only abnormal results are displayed) Labs Reviewed - No data to display  EKG  EKG Interpretation None       Radiology No results found.  Procedures Procedures (including critical care time)  Medications Ordered in UC Medications - No data to display   Initial Impression / Assessment and Plan / UC Course  I have reviewed the triage vital signs and the nursing notes.  Pertinent labs & imaging results that were available during my care of the patient were reviewed by me and considered in my medical decision making (see chart for details).       Final Clinical Impressions(s) / UC Diagnoses   Final diagnoses:  Upper respiratory tract infection, unspecified type  Axillary adenitis    New Prescriptions New Prescriptions   DOXYCYCLINE (VIBRAMYCIN) 100 MG CAPSULE    Take 1 capsule (100 mg total) by mouth 2 (two) times daily.   IPRATROPIUM (ATROVENT) 0.06 % NASAL SPRAY    Place 2 sprays into both nostrils 4 (four) times daily.     Linna Hoff, MD 01/01/17 [phone]

## 2017-01-01 NOTE — ED Triage Notes (Signed)
Pt having nasal congestion for 1 week. No otc meds taken. Swelling under her left armpit since yesterday, said it is tender.

## 2017-11-13 ENCOUNTER — Other Ambulatory Visit: Payer: Self-pay

## 2017-11-13 ENCOUNTER — Encounter (HOSPITAL_COMMUNITY): Payer: Self-pay | Admitting: Emergency Medicine

## 2017-11-13 ENCOUNTER — Ambulatory Visit (HOSPITAL_COMMUNITY)
Admission: EM | Admit: 2017-11-13 | Discharge: 2017-11-13 | Disposition: A | Discharge status: Home / Self Care | Payer: Medicaid Other | Attending: Orthopedic Surgery | Admitting: Orthopedic Surgery

## 2017-11-13 DIAGNOSIS — L03115 Cellulitis of right lower limb: Secondary | ICD-10-CM

## 2017-11-13 MED ORDER — SULFAMETHOXAZOLE-TRIMETHOPRIM 800-160 MG PO TABS: 1.0000 | ORAL_TABLET | Freq: Two times a day (BID) | ORAL | 0 refills | Status: DC

## 2017-11-13 NOTE — Discharge Instructions (Signed)
Please soak right foot and half hydrogen peroxide and half warm water or Epsom salt 3 times daily.  Take antibiotics as prescribed.  Return to the emergency department or urgent care facility for any fevers, increasing pain, swelling or redness.

## 2017-11-13 NOTE — ED Triage Notes (Signed)
Pt states she thinks she was bitten by something a week ago. Pt has white area with puss inbetween her toes on R foot.

## 2017-11-13 NOTE — ED Provider Notes (Signed)
[facility]    CSN: [id number] Arrival date & time: 11/13/17  1233     History   Chief Complaint Chief Complaint  Patient presents with  . Insect Bite    HPI Lori Pace is a 36 y.o. female presents to the urgent care facility for evaluation of possible insect bite between the right second and third toes.  Patient states for 7 days she has noticed pain with mild swelling between the right second and third toes on the right foot.  She denies any drainage.  She has had mild redness.  Pain is moderate.  She has not take any medications for pain or swelling.  She is ambulatory with no assistive devices.  She denies any trauma, injury, recent infections, history of diabetes.  HPI  Past Medical History:  Diagnosis Date  . Anemia   . Headache(784.0)   . ROM (rupture of membranes), premature 07/14/2014    Patient Active Problem List   Diagnosis Date Noted  . Normal pregnancy, repeat 09/03/2015  . SVD (spontaneous vaginal delivery) 09/03/2015  . Vacuum extraction, delivered, current hospitalization 07/16/2014  . ROM (rupture of membranes), premature 07/14/2014    Past Surgical History:  Procedure Laterality Date  . NO PAST SURGERIES      OB History    Gravida Para Term Preterm AB Living   2 2 2     2    SAB TAB Ectopic Multiple Live Births         0 2       Home Medications    Prior to Admission medications   Medication Sig Start Date End Date Taking? Authorizing Provider  doxycycline (VIBRAMYCIN) 100 MG capsule Take 1 capsule (100 mg total) by mouth 2 (two) times daily. 01/01/17   Linna HoffKindl, James D, MD  ferrous sulfate 325 (65 FE) MG tablet Take 1 tablet (325 mg total) by mouth 2 (two) times daily with a meal. 09/05/15   Tracey HarriesHenley, Thomas, MD  ibuprofen (ADVIL,MOTRIN) 600 MG tablet Take 1 tablet (600 mg total) by mouth every 6 (six) hours as needed. 09/05/15   Tracey HarriesHenley, Thomas, MD  ipratropium (ATROVENT) 0.06 % nasal spray Place 2 sprays into both nostrils 4 (four)  times daily. 01/01/17   Linna HoffKindl, James D, MD  NIFEdipine (PROCARDIA XL) 30 MG 24 hr tablet Take 1 tablet (30 mg total) by mouth daily. 09/10/15   Loren RacerYelverton, David, MD  ondansetron (ZOFRAN) 4 MG tablet Take 1 tablet (4 mg total) by mouth every 6 (six) hours. For n/v 02/08/16   Linna HoffKindl, James D, MD  oseltamivir (TAMIFLU) 75 MG capsule Take 1 capsule (75 mg total) by mouth every 12 (twelve) hours. Take all of medication. 02/08/16   Linna HoffKindl, James D, MD  oxyCODONE-acetaminophen (PERCOCET/ROXICET) 5-325 MG tablet Take 1 tablet by mouth every 6 (six) hours as needed (for pain scale 4-7). 09/05/15   Tracey HarriesHenley, Thomas, MD  Prenatal Vit-Fe Fumarate-FA (PRENATAL MULTIVITAMIN) TABS tablet Take 1 tablet by mouth daily at 12 noon. 09/05/15   Tracey HarriesHenley, Thomas, MD  sulfamethoxazole-trimethoprim (BACTRIM DS,SEPTRA DS) 800-160 MG tablet Take 1 tablet by mouth 2 (two) times daily. 11/13/17   Evon SlackGaines, Thomas C, PA-C    Family History Family History  Problem Relation Age of Onset  . Asthma Sister   . Cancer Maternal Aunt   . Diabetes Paternal Grandmother     Social History Social History   Tobacco Use  . Smoking status: Never Smoker  . Smokeless tobacco: Never Used  Substance Use  Topics  . Alcohol use: No  . Drug use: No     Allergies   Latex   Review of Systems Review of Systems  Constitutional: Negative for fever.  Respiratory: Negative for shortness of breath.   Cardiovascular: Negative for chest pain.  Gastrointestinal: Negative for abdominal pain.  Genitourinary: Negative for difficulty urinating, dysuria and urgency.  Musculoskeletal: Negative for back pain and myalgias.  Skin: Positive for rash.  Neurological: Negative for dizziness and headaches.     Physical Exam Triage Vital Signs ED Triage Vitals [11/13/17 1419]  Enc Vitals Group     BP 101/68     Pulse Rate 79     Resp 16     Temp 98.9 F (37.2 C)     Temp src      SpO2 98 %     Weight      Height      Head Circumference       Peak Flow      Pain Score 8     Pain Loc      Pain Edu?      Excl. in GC?    No data found.  Updated Vital Signs BP 101/68   Pulse 79   Temp 98.9 F (37.2 C)   Resp 16   SpO2 98%   Visual Acuity Right Eye Distance:   Left Eye Distance:   Bilateral Distance:    Right Eye Near:   Left Eye Near:    Bilateral Near:     Physical Exam  Constitutional: She is oriented to person, place, and time. She appears well-developed and well-nourished. No distress.  HENT:  Head: Normocephalic and atraumatic.  Mouth/Throat: Oropharynx is clear and moist.  Eyes: Conjunctivae are normal. Right eye exhibits no discharge. Left eye exhibits no discharge.  Neck: Normal range of motion.  Cardiovascular: Normal rate.  Pulmonary/Chest: No respiratory distress.  Musculoskeletal: Normal range of motion. She exhibits no deformity.  Neurological: She is alert and oriented to person, place, and time. She has normal reflexes.  Skin: Skin is warm and dry.  Patient with full range of motion of the right ankle with plantar flexion dorsiflexion.  She has minimal swelling between the second and third toe of the right foot with a small bullous lesion in between the 2 toes.  Bullous lesion is deroofed and purulent material is expressed from the lesion.  Internal portions of the bullous lesion did not seem to track deeper into the soft tissue.  Infection seems to be superficial.  There is very mild erythema along the top of the base of the right third toe that does not seem to be streaking up the foot.  Psychiatric: She has a normal mood and affect. Her behavior is normal. Thought content normal.     UC Treatments / Results  Labs (all labs ordered are listed, but only abnormal results are displayed) Labs Reviewed - No data to display  EKG  EKG Interpretation None       Radiology No results found.  Procedures Procedures (including critical care time)  Medications Ordered in UC Medications - No data  to display   Initial Impression / Assessment and Plan / UC Course  I have reviewed the triage vital signs and the nursing notes.  Pertinent labs & imaging results that were available during my care of the patient were reviewed by me and considered in my medical decision making (see chart for details).     36 year old female  with mild cellulitis to the right foot with a small abscess.  Abscess was deroofed and purulent material was expressed.  Patient is placed on Bactrim DS.  She will stop 3 times daily and half hydrogen peroxide and warm water or Epsom salt.  She is educated on signs and symptoms return to clinic for.  She will follow-up with PCP or urgent care facility in 1 week for recheck. Final Clinical Impressions(s) / UC Diagnoses   Final diagnoses:  Cellulitis of right foot    ED Discharge Orders        Ordered    sulfamethoxazole-trimethoprim (BACTRIM DS,SEPTRA DS) 800-160 MG tablet  2 times daily     11/13/17 1444         Evon Slack, [place] 11/13/17 1448

## 2018-01-20 ENCOUNTER — Other Ambulatory Visit: Payer: Self-pay

## 2018-01-20 ENCOUNTER — Encounter (HOSPITAL_COMMUNITY): Payer: Self-pay | Admitting: Emergency Medicine

## 2018-01-20 ENCOUNTER — Ambulatory Visit (HOSPITAL_COMMUNITY)
Admission: EM | Admit: 2018-01-20 | Discharge: 2018-01-20 | Disposition: A | Discharge status: Home / Self Care | Payer: Medicaid Other | Attending: Family Medicine | Admitting: Family Medicine

## 2018-01-20 DIAGNOSIS — R300 Dysuria: Secondary | ICD-10-CM | POA: Diagnosis not present

## 2018-01-20 DIAGNOSIS — N898 Other specified noninflammatory disorders of vagina: Secondary | ICD-10-CM | POA: Diagnosis present

## 2018-01-20 DIAGNOSIS — Z79899 Other long term (current) drug therapy: Secondary | ICD-10-CM | POA: Insufficient documentation

## 2018-01-20 DIAGNOSIS — Z975 Presence of (intrauterine) contraceptive device: Secondary | ICD-10-CM | POA: Insufficient documentation

## 2018-01-20 LAB — POCT URINALYSIS DIP (DEVICE)
Bilirubin Urine: NEGATIVE
GLUCOSE, UA: NEGATIVE mg/dL
Ketones, ur: NEGATIVE mg/dL
NITRITE: NEGATIVE
PH: 7.5 (ref 5.0–8.0)
PROTEIN: NEGATIVE mg/dL
Specific Gravity, Urine: 1.015 (ref 1.005–1.030)
UROBILINOGEN UA: 2 mg/dL — AB (ref 0.0–1.0)

## 2018-01-20 LAB — POCT PREGNANCY, URINE: PREG TEST UR: NEGATIVE

## 2018-01-20 MED ORDER — NITROFURANTOIN MONOHYD MACRO 100 MG PO CAPS: 100.0000 mg | ORAL_CAPSULE | Freq: Two times a day (BID) | ORAL | 0 refills | Status: AC

## 2018-01-20 NOTE — Discharge Instructions (Signed)
Increase your water intake to empty bladder regularly. Avoid caffeine and alcohol as this may irritate the bladder. I have sent your urine to be cultured, we will call you with any medications changes. I am also testing your urine for yeast, bacteria and stds, we will call if any positive and if any medications need to be started. If symptoms worsen or do not improve in the next week to return to be seen or to follow up with your PCP.

## 2018-01-20 NOTE — ED Triage Notes (Signed)
Pt c/o UTI symptoms, painful, frequent urination, pt also requesting testing for BV, c/o vaginal discharge and smell.

## 2018-01-20 NOTE — ED Provider Notes (Signed)
[facility]    CSN: [id number] Arrival date & time: 01/20/18  1357     History   Chief Complaint Chief Complaint  Patient presents with  . Dysuria  . Vaginal Discharge    HPI Lori Pace is a 37 y.o. female.   Lori Pace presents with complaints of pain and frequency with urination which started 2-3 days ago. She states she has also noted a vaginal odor which has been ongoing for some time. Without vaginal discharge, itching or burning. Some white vaginal discharge which she feels may be normal for her. She has intermittent abdominal cramping. Denies current pain. She is sexually active with 1 partner, does not use condoms, denies concern for stds. Denies previous stds. She has an iud in place. Without vaginal bleeding.    ROS per HPI.       Past Medical History:  Diagnosis Date  . Anemia   . Headache(784.0)   . ROM (rupture of membranes), premature 07/14/2014    Patient Active Problem List   Diagnosis Date Noted  . Normal pregnancy, repeat 09/03/2015  . SVD (spontaneous vaginal delivery) 09/03/2015  . Vacuum extraction, delivered, current hospitalization 07/16/2014  . ROM (rupture of membranes), premature 07/14/2014    Past Surgical History:  Procedure Laterality Date  . NO PAST SURGERIES      OB History    Gravida Para Term Preterm AB Living   2 2 2     2    SAB TAB Ectopic Multiple Live Births         0 2       Home Medications    Prior to Admission medications   Medication Sig Start Date End Date Taking? Authorizing Provider  ibuprofen (ADVIL,MOTRIN) 600 MG tablet Take 1 tablet (600 mg total) by mouth every 6 (six) hours as needed. 09/05/15   Tracey Harries, MD  ipratropium (ATROVENT) 0.06 % nasal spray Place 2 sprays into both nostrils 4 (four) times daily. 01/01/17   Linna Hoff, MD  nitrofurantoin, macrocrystal-monohydrate, (MACROBID) 100 MG capsule Take 1 capsule (100 mg total) by mouth 2 (two) times daily for 5 days. 01/20/18 01/25/18   Georgetta Haber, NP    Family History Family History  Problem Relation Age of Onset  . Asthma Sister   . Cancer Maternal Aunt   . Diabetes Paternal Grandmother     Social History Social History   Tobacco Use  . Smoking status: Never Smoker  . Smokeless tobacco: Never Used  Substance Use Topics  . Alcohol use: No  . Drug use: No     Allergies   Latex   Review of Systems Review of Systems   Physical Exam Triage Vital Signs ED Triage Vitals [01/20/18 1417]  Enc Vitals Group     BP 106/70     Pulse Rate 66     Resp 18     Temp 97.6 F (36.4 C)     Temp src      SpO2 98 %     Weight      Height      Head Circumference      Peak Flow      Pain Score      Pain Loc      Pain Edu?      Excl. in GC?    No data found.  Updated Vital Signs BP 106/70   Pulse 66   Temp 97.6 F (36.4 C)   Resp 18   SpO2  98%   Visual Acuity Right Eye Distance:   Left Eye Distance:   Bilateral Distance:    Right Eye Near:   Left Eye Near:    Bilateral Near:     Physical Exam  Constitutional: She is oriented to person, place, and time. She appears well-developed and well-nourished. No distress.  Cardiovascular: Normal rate, regular rhythm and normal heart sounds.  Pulmonary/Chest: Effort normal and breath sounds normal.  Abdominal: Soft. She exhibits no distension and no mass. There is no tenderness. There is no rigidity, no rebound, no guarding and no CVA tenderness.  Neurological: She is alert and oriented to person, place, and time.  Skin: Skin is warm and dry.     UC Treatments / Results  Labs (all labs ordered are listed, but only abnormal results are displayed) Labs Reviewed  POCT URINALYSIS DIP (DEVICE) - Abnormal; Notable for the following components:      Result Value   Hgb urine dipstick TRACE (*)    Urobilinogen, UA 2.0 (*)    Leukocytes, UA SMALL (*)    All other components within normal limits  URINE CULTURE  POCT PREGNANCY, URINE  URINE CYTOLOGY  ANCILLARY ONLY    EKG  EKG Interpretation None       Radiology No results found.  Procedures Procedures (including critical care time)  Medications Ordered in UC Medications - No data to display   Initial Impression / Assessment and Plan / UC Course  I have reviewed the triage vital signs and the nursing notes.  Pertinent labs & imaging results that were available during my care of the patient were reviewed by me and considered in my medical decision making (see chart for details).     Without pain, fevers, vaginal discharge or bleeding. GU exam deferred. Urine cytology pending. Small leuks as well as hgb to urine, with UTI symptoms, will treat as UTI at this time. Urine culture pending. Will notify of any positive findings and if any changes to treatment are needed.  If symptoms worsen or do not improve in the next week to return to be seen or to follow up with PCP.  Patient verbalized understanding and agreeable to plan.     Final Clinical Impressions(s) / UC Diagnoses   Final diagnoses:  Dysuria  Vaginal odor    ED Discharge Orders        Ordered    nitrofurantoin, macrocrystal-monohydrate, (MACROBID) 100 MG capsule  2 times daily     01/20/18 1452       Controlled Substance Prescriptions  Controlled Substance Registry consulted? Not Applicable   Georgetta HaberBurky, Natalie B, NP 01/20/18 1459

## 2018-01-22 LAB — URINE CULTURE

## 2018-01-22 LAB — URINE CYTOLOGY ANCILLARY ONLY
Chlamydia: NEGATIVE
Neisseria Gonorrhea: NEGATIVE
TRICH (WINDOWPATH): NEGATIVE

## 2018-01-24 LAB — URINE CYTOLOGY ANCILLARY ONLY: Candida vaginitis: NEGATIVE

## 2018-01-27 ENCOUNTER — Telehealth (HOSPITAL_COMMUNITY): Payer: Self-pay | Admitting: Emergency Medicine

## 2018-01-27 MED ORDER — METRONIDAZOLE 500 MG PO TABS: 500.0000 mg | ORAL_TABLET | Freq: Two times a day (BID) | ORAL | 0 refills | Status: AC

## 2018-01-27 NOTE — Telephone Encounter (Signed)
Called pt to notify of recent lab results.... Pt ID'd properly (Urine Culture, Gon/Chlam, Trich, yeast, BV) Reports Urinary sx have subsided but still having vag irritation/d/c Sts she would like Flagyl to be called into [facility] on [address]... Rx sent.  Pt is taking/tolarating well meds given at visit.  Adv pt if sx are not getting better to return or to f/u w/PCP Education on safe sex given Notified pt that lab results can be obtained through MyChart Pt verb understanding.

## 2018-01-27 NOTE — Telephone Encounter (Signed)
-----   Message from Isa RankinLaura Wilson Murray, MD sent at 01/25/2018  9:05 PM EDT ----- Clinical staff, please let patient know that test for gardnerella (bacterial vaginosis) was positive.  This only needs to be treated if there are persistent symptoms, such as vaginal irritation/discharge.   If these symptoms are present, ok to send rx for metronidazole 500mg  bid x 7d #14 no refills or metronidazole vaginal gel 0.75% 1 applicatorful bid x 7d #14 no refills.  Recheck or followup with your primary care provider for further evaluation if symptoms are not improving.   Please also refer to result note of 3/9 regarding positive urine culture.  Ria ClockLaura Murray MD

## 2019-06-28 ENCOUNTER — Other Ambulatory Visit: Payer: Self-pay

## 2019-06-28 DIAGNOSIS — Z20822 Contact with and (suspected) exposure to covid-19: Secondary | ICD-10-CM

## 2019-06-29 LAB — NOVEL CORONAVIRUS, NAA: SARS-CoV-2, NAA: DETECTED — AB

## 2019-07-01 ENCOUNTER — Telehealth: Payer: Self-pay | Admitting: Critical Care Medicine

## 2019-07-01 NOTE — Telephone Encounter (Signed)
I connected with the patient.  She is self isolating which I requested not end until 8/21.  She is having mild symptoms only.   She wanted her children tested and I gave her the sites dates times of the next three events.  She knows HD may call.

## 2019-07-12 ENCOUNTER — Other Ambulatory Visit: Payer: Self-pay

## 2019-07-12 DIAGNOSIS — Z20822 Contact with and (suspected) exposure to covid-19: Secondary | ICD-10-CM

## 2019-07-13 LAB — NOVEL CORONAVIRUS, NAA: SARS-CoV-2, NAA: NOT DETECTED

## 2019-09-25 ENCOUNTER — Other Ambulatory Visit: Payer: Self-pay

## 2019-09-25 DIAGNOSIS — Z20822 Contact with and (suspected) exposure to covid-19: Secondary | ICD-10-CM

## 2019-09-26 LAB — NOVEL CORONAVIRUS, NAA: SARS-CoV-2, NAA: NOT DETECTED

## 2019-10-24 ENCOUNTER — Other Ambulatory Visit: Payer: Self-pay

## 2019-10-24 DIAGNOSIS — Z20822 Contact with and (suspected) exposure to covid-19: Secondary | ICD-10-CM

## 2019-10-26 LAB — NOVEL CORONAVIRUS, NAA: SARS-CoV-2, NAA: NOT DETECTED

## 2019-11-20 ENCOUNTER — Ambulatory Visit: Payer: Self-pay | Attending: Internal Medicine

## 2019-11-20 DIAGNOSIS — Z20822 Contact with and (suspected) exposure to covid-19: Secondary | ICD-10-CM | POA: Insufficient documentation

## 2019-12-20 ENCOUNTER — Ambulatory Visit: Payer: Self-pay | Attending: Internal Medicine

## 2019-12-20 DIAGNOSIS — Z20822 Contact with and (suspected) exposure to covid-19: Secondary | ICD-10-CM | POA: Insufficient documentation

## 2019-12-21 LAB — NOVEL CORONAVIRUS, NAA: SARS-CoV-2, NAA: NOT DETECTED

## 2020-01-24 ENCOUNTER — Other Ambulatory Visit: Payer: Self-pay
